# Patient Record
Sex: Male | Born: 1975 | Race: White | Hispanic: No | Marital: Married | Smoking: Former smoker
Health system: Southern US, Community
[De-identification: ages and names within clinical notes are randomized; demographics above are authoritative.]

## PROBLEM LIST (undated history)

## (undated) DIAGNOSIS — B1920 Unspecified viral hepatitis C without hepatic coma: Secondary | ICD-10-CM

## (undated) DIAGNOSIS — Q431 Hirschsprung's disease: Secondary | ICD-10-CM

## (undated) DIAGNOSIS — F988 Other specified behavioral and emotional disorders with onset usually occurring in childhood and adolescence: Secondary | ICD-10-CM

## (undated) DIAGNOSIS — M1990 Unspecified osteoarthritis, unspecified site: Secondary | ICD-10-CM

## (undated) HISTORY — PX: COLON SURGERY: SHX602

---

## 1998-05-01 DIAGNOSIS — B1920 Unspecified viral hepatitis C without hepatic coma: Secondary | ICD-10-CM

## 1998-05-01 HISTORY — DX: Unspecified viral hepatitis C without hepatic coma: B19.20

## 2014-02-10 ENCOUNTER — Other Ambulatory Visit (HOSPITAL_COMMUNITY): Payer: Self-pay | Admitting: Pediatrics

## 2014-02-10 DIAGNOSIS — Z8249 Family history of ischemic heart disease and other diseases of the circulatory system: Secondary | ICD-10-CM

## 2014-02-16 ENCOUNTER — Ambulatory Visit (HOSPITAL_COMMUNITY)
Admission: RE | Admit: 2014-02-16 | Discharge: 2014-02-16 | Disposition: A | Discharge status: Home / Self Care | Payer: 59 | Source: Ambulatory Visit | Attending: Pediatrics | Admitting: Pediatrics

## 2014-02-16 DIAGNOSIS — Z8249 Family history of ischemic heart disease and other diseases of the circulatory system: Secondary | ICD-10-CM | POA: Diagnosis present

## 2014-06-12 ENCOUNTER — Ambulatory Visit: Payer: Self-pay | Admitting: Family Medicine

## 2017-08-25 ENCOUNTER — Ambulatory Visit
Admission: EM | Admit: 2017-08-25 | Discharge: 2017-08-25 | Disposition: A | Discharge status: Home / Self Care | Payer: BLUE CROSS/BLUE SHIELD | Attending: Family Medicine | Admitting: Family Medicine

## 2017-08-25 ENCOUNTER — Other Ambulatory Visit: Payer: Self-pay

## 2017-08-25 DIAGNOSIS — J209 Acute bronchitis, unspecified: Secondary | ICD-10-CM

## 2017-08-25 HISTORY — DX: Unspecified viral hepatitis C without hepatic coma: B19.20

## 2017-08-25 HISTORY — DX: Other specified behavioral and emotional disorders with onset usually occurring in childhood and adolescence: F98.8

## 2017-08-25 MED ORDER — HYDROCOD POLST-CPM POLST ER 10-8 MG/5ML PO SUER: 5.0000 mL | Freq: Two times a day (BID) | ORAL | 0 refills | Status: DC | PRN

## 2017-08-25 MED ORDER — DOXYCYCLINE HYCLATE 100 MG PO CAPS: 100.0000 mg | ORAL_CAPSULE | Freq: Two times a day (BID) | ORAL | 0 refills | Status: DC

## 2017-08-25 NOTE — ED Triage Notes (Signed)
Pt c/o cough for about 4 weeks. He had a fever of 101 at the onset of sx. He thought he was improving last week, but suddenly worsened.

## 2017-08-25 NOTE — ED Provider Notes (Signed)
[facility]  CSN: [id number] Arrival date & time: 08/25/17  1209  History   Chief Complaint Chief Complaint  Patient presents with  . Cough   HPI  42 year old male presents with cough and wheezing.  Patient reports that he has had ongoing cough, wheezing for the past month.  He states that he initially improved but then worsened a few days ago.  He had a fever initially on but has had no fever recently.  He denies shortness of breath.  He continues to have productive cough.  Symptoms are severe.  No known exacerbating/relieving factors.  No other associated symptoms. No other complaints.  Past Medical History:  Diagnosis Date  . ADD (attention deficit disorder)   . Hepatitis C    Past Surgical History:  Procedure Laterality Date  . COLON SURGERY     Hirschprung's corrective surgery   Home Medications    Prior to Admission medications   Medication Sig Start Date End Date Taking? Authorizing Provider  chlorpheniramine-HYDROcodone (TUSSIONEX PENNKINETIC ER) 10-8 MG/5ML SUER Take 5 mLs by mouth every 12 (twelve) hours as needed. 08/25/17   Tommie Sams, DO  doxycycline (VIBRAMYCIN) 100 MG capsule Take 1 capsule (100 mg total) by mouth 2 (two) times daily. 08/25/17   Tommie Sams, DO    Family History Family History  Problem Relation Age of Onset  . Atrial fibrillation Mother   . Heart failure Father   . Diabetes Father   . Kidney failure Father     Social History Social History   Tobacco Use  . Smoking status: Former Games developer  . Smokeless tobacco: Never Used  . Tobacco comment: Former social smoker  Substance Use Topics  . Alcohol use: Yes    Alcohol/week: 5.4 oz    Types: 9 Cans of beer per week  . Drug use: Never     Allergies   Patient has no known allergies.   Review of Systems Review of Systems  Constitutional:       No recent fever.  Respiratory: Positive for shortness of breath and wheezing.    Physical Exam Triage Vital Signs ED  Triage Vitals  Enc Vitals Group     BP 08/25/17 1217 131/86     Pulse Rate 08/25/17 1217 73     Resp 08/25/17 1217 16     Temp 08/25/17 1217 97.7 F (36.5 C)     Temp Source 08/25/17 1217 Oral     SpO2 08/25/17 1217 99 %     Weight 08/25/17 1221 (!) 310 lb (140.6 kg)     Height 08/25/17 1221 6\' 3"  (1.905 m)     Head Circumference --      Peak Flow --      Pain Score 08/25/17 1218 0     Pain Loc --      Pain Edu? --      Excl. in GC? --   Updated Vital Signs BP 131/86 (BP Location: Left Arm)   Pulse 73   Temp 97.7 F (36.5 C) (Oral)   Resp 16   Ht 6\' 3"  (1.905 m)   Wt (!) 310 lb (140.6 kg)   SpO2 99%   BMI 38.75 kg/m   Physical Exam  Constitutional: He is oriented to person, place, and time. He appears well-developed. No distress.  HENT:  Head: Normocephalic and atraumatic.  Nose: Nose normal.  Normal TM's.  Cardiovascular: Normal rate and regular rhythm.  Pulmonary/Chest: Effort normal and breath sounds normal. He  has no wheezes. He has no rales.  Neurological: He is alert and oriented to person, place, and time.  Psychiatric: He has a normal mood and affect. His behavior is normal.  Nursing note and vitals reviewed.  UC Treatments / Results  Labs (all labs ordered are listed, but only abnormal results are displayed) Labs Reviewed - No data to display  EKG None Radiology No results found.  Procedures Procedures (including critical care time)  Medications Ordered in UC Medications - No data to display   Initial Impression / Assessment and Plan / UC Course  I have reviewed the triage vital signs and the nursing notes.  Pertinent labs & imaging results that were available during my care of the patient were reviewed by me and considered in my medical decision making (see chart for details).     42 year old male presents with subacute bronchitis.  Given duration of symptoms and recent worsening,treating with doxycycline.  Tussionex for cough.  Final  Clinical Impressions(s) / UC Diagnoses   Final diagnoses:  Subacute bronchitis    ED Discharge Orders        Ordered    doxycycline (VIBRAMYCIN) 100 MG capsule  2 times daily     08/25/17 1238    chlorpheniramine-HYDROcodone (TUSSIONEX PENNKINETIC ER) 10-8 MG/5ML SUER  Every 12 hours PRN     08/25/17 1238     Controlled Substance Prescriptions [place] Controlled Substance Registry consulted? Not Applicable   Tommie Sams, DO 08/25/17 1257

## 2017-12-17 ENCOUNTER — Ambulatory Visit (INDEPENDENT_AMBULATORY_CARE_PROVIDER_SITE_OTHER): Payer: BLUE CROSS/BLUE SHIELD

## 2017-12-17 ENCOUNTER — Ambulatory Visit
Admission: EM | Admit: 2017-12-17 | Discharge: 2017-12-17 | Disposition: A | Discharge status: Home / Self Care | Payer: BLUE CROSS/BLUE SHIELD | Attending: Internal Medicine | Admitting: Internal Medicine

## 2017-12-17 ENCOUNTER — Other Ambulatory Visit: Payer: Self-pay

## 2017-12-17 DIAGNOSIS — J069 Acute upper respiratory infection, unspecified: Secondary | ICD-10-CM

## 2017-12-17 MED ORDER — DOXYCYCLINE HYCLATE 100 MG PO CAPS: 100.0000 mg | ORAL_CAPSULE | Freq: Two times a day (BID) | ORAL | 0 refills | Status: DC

## 2017-12-17 MED ORDER — HYDROCOD POLST-CPM POLST ER 10-8 MG/5ML PO SUER: 5.0000 mL | Freq: Every evening | ORAL | 0 refills | Status: DC | PRN

## 2017-12-17 MED ORDER — ALBUTEROL SULFATE HFA 108 (90 BASE) MCG/ACT IN AERS: 2.0000 | INHALATION_SPRAY | Freq: Four times a day (QID) | RESPIRATORY_TRACT | 0 refills | Status: DC | PRN

## 2017-12-17 NOTE — ED Provider Notes (Signed)
[facility] ____________________________________________  Time seen: Approximately 9:18 AM  I have reviewed the triage vital signs and the nursing notes.   HISTORY  Chief Complaint Cough  HPI Robert Bush is a 42 y.o. male presenting for evaluation of cough and congestion symptoms present for approximately 2 weeks.  States however he has continued to have a cough occasionally since April.  States in April after a trip to [place] he ended up with bronchitis that nearly fully resolved.  States recently went to [place] again and subsequently cough and congestion symptoms.  States still with some nasal congestion, but states the nasal congestion has somewhat improved.  Initially had a sore throat which is since resolved.  Denies known fevers.  States cough is worse at night and first thing in the morning, productive of yellowish thick sputum.  No hemoptysis.  No extremity atypical  pain or  Swelling.  Has continue to remain active.  Did take some leftover amoxicillin without resolution.  Also had some leftover Tussionex that did help with his cough.  Continues to eat and drink well.  No other alleviating measures attempted.  States has had occasional wheezing, none persistently.  Denies others at home sick with similar.  Does have a history of exercise-induced asthma, no known allergy issues.  Reports otherwise feels well denies other complaints.  Denies chest pain, shortness of breath, abdominal pain, extremity pain, extremity swelling or rash. Denies recent sickness. Denies recent antibiotic use.   Delton Prairie, MD: PCP   Past Medical History:  Diagnosis Date  . ADD (attention deficit disorder)   . Hepatitis C     There are no active problems to display for this patient.   Past Surgical History:  Procedure Laterality Date  . COLON SURGERY     Hirschprung's corrective surgery     No current facility-administered medications for this encounter.   Current  Outpatient Medications:  .  albuterol (PROVENTIL HFA;VENTOLIN HFA) 108 (90 Base) MCG/ACT inhaler, Inhale 2 puffs into the lungs every 6 (six) hours as needed for wheezing., Disp: 1 Inhaler, Rfl: 0 .  chlorpheniramine-HYDROcodone (TUSSIONEX PENNKINETIC ER) 10-8 MG/5ML SUER, Take 5 mLs by mouth at bedtime as needed for cough. do not drive or operate machinery while taking as can cause drowsiness., Disp: 50 mL, Rfl: 0 .  doxycycline (VIBRAMYCIN) 100 MG capsule, Take 1 capsule (100 mg total) by mouth 2 (two) times daily., Disp: 20 capsule, Rfl: 0  Allergies Patient has no known allergies.  Family History  Problem Relation Age of Onset  . Atrial fibrillation Mother   . Heart failure Father   . Diabetes Father   . Kidney failure Father     Social History Social History   Tobacco Use  . Smoking status: Former Games developer  . Smokeless tobacco: Never Used  . Tobacco comment: Former social smoker  Substance Use Topics  . Alcohol use: Yes    Alcohol/week: 9.0 standard drinks    Types: 9 Cans of beer per week    Comment: occasionally  . Drug use: Never    Review of Systems Constitutional: No fever/chills ENT: AS above.  Cardiovascular: Denies chest pain. Respiratory: Denies shortness of breath. Gastrointestinal: No abdominal pain.  Skin: Negative for rash.   ____________________________________________   PHYSICAL EXAM:  VITAL SIGNS: ED Triage Vitals  Enc Vitals Group     BP 12/17/17 0823 138/90     Pulse Rate 12/17/17 0823 66     Resp 12/17/17 0823 18  Temp 12/17/17 0823 98.6 F (37 C)     Temp Source 12/17/17 0823 Oral     SpO2 12/17/17 0823 99 %     Weight 12/17/17 0821 (!) 320 lb (145.2 kg)     Height 12/17/17 0821 6\' 2"  (1.88 m)     Head Circumference --      Peak Flow --      Pain Score 12/17/17 0821 2     Pain Loc --      Pain Edu? --      Excl. in GC? --     Constitutional: Alert and oriented. Well appearing and in no acute distress. Eyes: Conjunctivae are  normal.  Head: Atraumatic. No sinus tenderness to palpation. No swelling. No erythema.  Ears: no erythema, normal TMs bilaterally.   Nose:Nasal congestion   Mouth/Throat: Mucous membranes are moist. No pharyngeal erythema. No tonsillar swelling or exudate.  Neck: No stridor.  No cervical spine tenderness to palpation. Hematological/Lymphatic/Immunilogical: No cervical lymphadenopathy. Cardiovascular: Normal rate, regular rhythm. Grossly normal heart sounds.  Good peripheral circulation. Respiratory: Normal respiratory effort.  No retractions. No wheezes.  Mild scattered rhonchi.  Good air movement.  Dry intermittent cough noted in room without bronchospasm.  Speaks in complete sentences. Musculoskeletal: Ambulatory with steady gait.  No lower extremity edema noted. Neurologic:  Normal speech and language. No gait instability. Skin:  Skin appears warm, dry and intact. No rash noted. Psychiatric: Mood and affect are normal. Speech and behavior are normal.  ___________________________________________   LABS (all labs ordered are listed, but only abnormal results are displayed)  Labs Reviewed - No data to display  RADIOLOGY  Dg Chest 2 View  Result Date: 12/17/2017 CLINICAL DATA:  Onset of cough 10 days ago while in [place]. Patient reports respiratory illness 4 months ago with residual cough since then. EXAM: CHEST - 2 VIEW COMPARISON:  None. FINDINGS: The lungs are adequately inflated and clear. The heart and pulmonary vascularity are normal. The mediastinum is normal in width. There is no pleural effusion. The trachea is midline. The bony thorax exhibits no acute abnormality. IMPRESSION: There is no pneumonia nor other acute cardiopulmonary abnormality. Electronically Signed   By: David  [place] M.D.   On: 12/17/2017 09:10   ____________________________________________   PROCEDURES Procedures    INITIAL IMPRESSION / ASSESSMENT AND PLAN / ED COURSE  Pertinent labs & imaging results  that were available during my care of the patient were reviewed by me and considered in my medical decision making (see chart for details).  Well-appearing patient.  No acute distress.  Suspect recent viral upper respiratory infection is with continued chest congestion and nasal congestion, will treat with oral doxycycline, PRN Tussionex.  No current wheezing, and patient does express concern as his insurance will be ending at the end of the month, will Rx albuterol inhaler.  Encourage rest, fluids, supportive care.Discussed indication, risks and benefits of medications with patient.  Discussed follow up with Primary care physician this week. Discussed follow up and return parameters including no resolution or any worsening concerns. Patient verbalized understanding and agreed to plan.   ____________________________________________   FINAL CLINICAL IMPRESSION(S) / ED DIAGNOSES  Final diagnoses:  Acute upper respiratory infection     ED Discharge Orders         Ordered    doxycycline (VIBRAMYCIN) 100 MG capsule  2 times daily     12/17/17 0928    chlorpheniramine-HYDROcodone (TUSSIONEX PENNKINETIC ER) 10-8 MG/5ML SUER  At bedtime PRN  12/17/17 0928    albuterol (PROVENTIL HFA;VENTOLIN HFA) 108 (90 Base) MCG/ACT inhaler  Every 6 hours PRN     12/17/17 0940           Note: This dictation was prepared with Dragon dictation along with smaller phrase technology. Any transcriptional errors that result from this process are unintentional.      lm   Renford Dills, NP 12/17/17 [phone]

## 2017-12-17 NOTE — Discharge Instructions (Addendum)
Take medication as prescribed. Rest. Drink plenty of fluids.  ° °Follow up with your primary care physician this week as needed. Return to Urgent care for new or worsening concerns.  ° °

## 2017-12-17 NOTE — ED Triage Notes (Signed)
Patient complains of cough that started 10 days ago. Patient states that he got sick while in [place] and returned last week. Patient states that he was sick back in April and cough has been residual since then. Patient reports that he is having a productive cough.

## 2018-03-31 HISTORY — PX: KNEE ARTHROSCOPY: SHX127

## 2018-04-19 ENCOUNTER — Other Ambulatory Visit: Payer: Self-pay | Admitting: Orthopedic Surgery

## 2018-04-19 ENCOUNTER — Other Ambulatory Visit (HOSPITAL_COMMUNITY): Payer: Self-pay | Admitting: Orthopedic Surgery

## 2018-04-19 DIAGNOSIS — M25511 Pain in right shoulder: Secondary | ICD-10-CM

## 2018-05-02 ENCOUNTER — Ambulatory Visit
Admission: RE | Admit: 2018-05-02 | Discharge: 2018-05-02 | Disposition: A | Discharge status: Home / Self Care | Payer: Medicaid Other | Source: Ambulatory Visit | Attending: Orthopedic Surgery | Admitting: Orthopedic Surgery

## 2018-05-02 DIAGNOSIS — M25511 Pain in right shoulder: Secondary | ICD-10-CM | POA: Insufficient documentation

## 2018-05-10 ENCOUNTER — Other Ambulatory Visit: Payer: Self-pay | Admitting: Orthopedic Surgery

## 2018-05-20 ENCOUNTER — Other Ambulatory Visit: Payer: Self-pay

## 2018-05-20 ENCOUNTER — Encounter
Admission: RE | Admit: 2018-05-20 | Discharge: 2018-05-20 | Disposition: A | Discharge status: Home / Self Care | Payer: Medicaid Other | Source: Ambulatory Visit | Attending: Orthopedic Surgery | Admitting: Orthopedic Surgery

## 2018-05-20 HISTORY — DX: Hirschsprung's disease: Q43.1

## 2018-05-20 HISTORY — DX: Unspecified osteoarthritis, unspecified site: M19.90

## 2018-05-20 NOTE — Pre-Procedure Instructions (Signed)
ECG 12-lead11/27/2019 [facility] Component Name Value Ref Range  Vent Rate (bpm) 65   PR Interval (msec) 222   QRS Interval (msec) 96   QT Interval (msec) 386   QTc (msec) 401   Other Result Information  This result has an attachment that is not available.  Result Narrative  Sinus rhythm 1st degree AV block Otherwise normal ECG No previous ECGs available I reviewed and concur with this report. Electronically signed EM:[email or web address] MD, MARK [phone]) on 04/01/2018 4:49:29 PM  Status Results Details   Encounter Summary

## 2018-05-20 NOTE — Patient Instructions (Signed)
Your procedure is scheduled on: 05-23-18 THURSDAY Report to Same Day Surgery 2nd floor medical mall [facility] Entrance-take elevator on left to 2nd floor.  Check in with surgery information desk.) To find out your arrival time please call [phone] between 1PM - 3PM on 05-22-18 [facility]  Remember: Instructions that are not followed completely may result in serious medical risk, up to and including death, or upon the discretion of your surgeon and anesthesiologist your surgery may need to be rescheduled.    _x___ 1. Do not eat food after midnight the night before your procedure. NO GUM OR CANDY AFTER MIDNIGHT.  You may drink clear liquids up to 2 hours before you are scheduled to arrive at the hospital for your procedure.  Do not drink clear liquids within 2 hours of your scheduled arrival to the hospital.  Clear liquids include  --Water or Apple juice without pulp  --Clear carbohydrate beverage such as ClearFast or Gatorade  --Black Coffee or Clear Tea (No milk, no creamers, do not add anything to the coffee or Tea   ____Ensure clear carbohydrate drink on the way to the hospital for bariatric patients  ____Ensure clear carbohydrate drink 3 hours before surgery for Dr Rutherford Nail patients if physician instructed.    __x__ 2. No Alcohol for 24 hours before or after surgery.   __x__3. No Smoking or e-cigarettes for 24 prior to surgery.  Do not use any chewable tobacco products for at least 6 hour prior to surgery   ____  4. Bring all medications with you on the day of surgery if instructed.    __x__ 5. Notify your doctor if there is any change in your medical condition     (cold, fever, infections).    x___6. On the morning of surgery brush your teeth with toothpaste and water.  You may rinse your mouth with mouth wash if you wish.  Do not swallow any toothpaste or mouthwash.   Do not wear jewelry, make-up, hairpins, clips or nail polish.  Do not wear lotions, powders, or perfumes.  You may wear deodorant.  Do not shave 48 hours prior to surgery. Men may shave face and neck.  Do not bring valuables to the hospital.    [facility] is not responsible for any belongings or valuables.               Contacts, dentures or bridgework may not be worn into surgery.  Leave your suitcase in the car. After surgery it may be brought to your room.  For patients admitted to the hospital, discharge time is determined by your treatment team.  _  Patients discharged the day of surgery will not be allowed to drive home.  You will need someone to drive you home and stay with you the night of your procedure.    Please read over the following fact sheets that you were given:   [facility] Preparing for Surgery  ____ Take anti-hypertensive listed below, cardiac, seizure, asthma, anti-reflux and psychiatric medicines. These include:  1. NONE  2.  3.  4.  5.  6.  ____Fleets enema or Magnesium Citrate as directed.   _x___ Use CHG Soap or sage wipes as directed on instruction sheet   ____ Use inhalers on the day of surgery and bring to hospital day of surgery  ____ Stop Metformin and Janumet 2 days prior to surgery.    ____ Take 1/2 of usual insulin dose the night before surgery and none on the  morning surgery.   ____ Follow recommendations from Cardiologist, Pulmonologist or PCP regarding stopping Aspirin, Coumadin, Plavix ,Eliquis, Effient, or Pradaxa, and Pletal.  X____Stop Anti-inflammatories such as Advil, Aleve, Ibuprofen, Motrin, Naproxen,MOBIC, Naprosyn, Goodies powders or aspirin products NOW-OK to take Tylenol    ____ Stop supplements until after surgery   ____ Bring C-Pap to the hospital.

## 2018-05-21 ENCOUNTER — Encounter
Admission: RE | Admit: 2018-05-21 | Discharge: 2018-05-21 | Disposition: A | Discharge status: Home / Self Care | Payer: Medicaid Other | Source: Ambulatory Visit | Attending: Orthopedic Surgery | Admitting: Orthopedic Surgery

## 2018-05-21 DIAGNOSIS — Z01812 Encounter for preprocedural laboratory examination: Secondary | ICD-10-CM | POA: Diagnosis present

## 2018-05-21 LAB — CBC WITH DIFFERENTIAL/PLATELET
Abs Immature Granulocytes: 0.02 10*3/uL (ref 0.00–0.07)
Basophils Absolute: 0 10*3/uL (ref 0.0–0.1)
Basophils Relative: 0 %
Eosinophils Absolute: 0.2 10*3/uL (ref 0.0–0.5)
Eosinophils Relative: 4 %
HCT: 44.5 % (ref 39.0–52.0)
Hemoglobin: 14.6 g/dL (ref 13.0–17.0)
Immature Granulocytes: 0 %
Lymphocytes Relative: 23 %
Lymphs Abs: 1.3 10*3/uL (ref 0.7–4.0)
MCH: 29.4 pg (ref 26.0–34.0)
MCHC: 32.8 g/dL (ref 30.0–36.0)
MCV: 89.7 fL (ref 80.0–100.0)
Monocytes Absolute: 0.4 10*3/uL (ref 0.1–1.0)
Monocytes Relative: 6 %
NEUTROS ABS: 3.9 10*3/uL (ref 1.7–7.7)
Neutrophils Relative %: 67 %
Platelets: 162 10*3/uL (ref 150–400)
RBC: 4.96 MIL/uL (ref 4.22–5.81)
RDW: 12.8 % (ref 11.5–15.5)
WBC: 5.8 10*3/uL (ref 4.0–10.5)
nRBC: 0 % (ref 0.0–0.2)

## 2018-05-21 LAB — BASIC METABOLIC PANEL
ANION GAP: 5 (ref 5–15)
BUN: 18 mg/dL (ref 6–20)
CO2: 27 mmol/L (ref 22–32)
Calcium: 9.4 mg/dL (ref 8.9–10.3)
Chloride: 108 mmol/L (ref 98–111)
Creatinine, Ser: 0.96 mg/dL (ref 0.61–1.24)
GFR calc Af Amer: >60 mL/min (ref >60)
GFR calc non Af Amer: >60 mL/min (ref >60)
Glucose, Bld: 135 mg/dL — ABNORMAL HIGH (ref 70–99)
POTASSIUM: 3.7 mmol/L (ref 3.5–5.1)
Sodium: 140 mmol/L (ref 135–145)

## 2018-05-21 LAB — APTT: APTT: 29 s (ref 24–36)

## 2018-05-21 LAB — PROTIME-INR
INR: 1.34
PROTHROMBIN TIME: 16.4 s — AB (ref 11.4–15.2)

## 2018-05-22 ENCOUNTER — Encounter: Payer: Self-pay | Admitting: *Deleted

## 2018-05-22 MED ORDER — CEFAZOLIN SODIUM-DEXTROSE 2-4 GM/100ML-% IV SOLN
2.0000 g | INTRAVENOUS | Status: AC
  Administered 2018-05-23: 2 g via INTRAVENOUS

## 2018-05-23 ENCOUNTER — Ambulatory Visit: Payer: Medicaid Other | Admitting: Certified Registered Nurse Anesthetist

## 2018-05-23 ENCOUNTER — Encounter: Payer: Self-pay | Admitting: Certified Registered Nurse Anesthetist

## 2018-05-23 ENCOUNTER — Other Ambulatory Visit: Payer: Self-pay

## 2018-05-23 ENCOUNTER — Ambulatory Visit
Admission: RE | Admit: 2018-05-23 | Discharge: 2018-05-23 | Disposition: A | Discharge status: Home / Self Care | Payer: Medicaid Other | Attending: Orthopedic Surgery | Admitting: Orthopedic Surgery

## 2018-05-23 ENCOUNTER — Encounter
Admission: RE | Disposition: A | Discharge status: Home / Self Care | Payer: Self-pay | Source: Home / Self Care | Attending: Orthopedic Surgery

## 2018-05-23 DIAGNOSIS — Z87891 Personal history of nicotine dependence: Secondary | ICD-10-CM | POA: Insufficient documentation

## 2018-05-23 DIAGNOSIS — M1990 Unspecified osteoarthritis, unspecified site: Secondary | ICD-10-CM | POA: Diagnosis not present

## 2018-05-23 DIAGNOSIS — M75121 Complete rotator cuff tear or rupture of right shoulder, not specified as traumatic: Secondary | ICD-10-CM | POA: Insufficient documentation

## 2018-05-23 DIAGNOSIS — B1920 Unspecified viral hepatitis C without hepatic coma: Secondary | ICD-10-CM | POA: Insufficient documentation

## 2018-05-23 DIAGNOSIS — Z791 Long term (current) use of non-steroidal anti-inflammatories (NSAID): Secondary | ICD-10-CM | POA: Diagnosis not present

## 2018-05-23 DIAGNOSIS — Q431 Hirschsprung's disease: Secondary | ICD-10-CM | POA: Insufficient documentation

## 2018-05-23 DIAGNOSIS — Z6841 Body Mass Index (BMI) 40.0 and over, adult: Secondary | ICD-10-CM | POA: Insufficient documentation

## 2018-05-23 DIAGNOSIS — Z79899 Other long term (current) drug therapy: Secondary | ICD-10-CM | POA: Insufficient documentation

## 2018-05-23 DIAGNOSIS — M25811 Other specified joint disorders, right shoulder: Secondary | ICD-10-CM | POA: Diagnosis not present

## 2018-05-23 DIAGNOSIS — X58XXXA Exposure to other specified factors, initial encounter: Secondary | ICD-10-CM | POA: Insufficient documentation

## 2018-05-23 DIAGNOSIS — S46211A Strain of muscle, fascia and tendon of other parts of biceps, right arm, initial encounter: Secondary | ICD-10-CM | POA: Insufficient documentation

## 2018-05-23 HISTORY — PX: SHOULDER ARTHROSCOPY WITH ROTATOR CUFF REPAIR: SHX5685

## 2018-05-23 SURGERY — ARTHROSCOPY, SHOULDER, WITH ROTATOR CUFF REPAIR
Anesthesia: General | Site: Shoulder | Laterality: Right

## 2018-05-23 MED ORDER — PROPOFOL 10 MG/ML IV BOLUS
INTRAVENOUS | Status: DC | PRN
  Administered 2018-05-23: 200 mg via INTRAVENOUS

## 2018-05-23 MED ORDER — EPINEPHRINE PF 1 MG/ML IJ SOLN
INTRAMUSCULAR | Status: DC | PRN
  Administered 2018-05-23: 8 mL

## 2018-05-23 MED ORDER — CEFAZOLIN SODIUM-DEXTROSE 2-4 GM/100ML-% IV SOLN
INTRAVENOUS | Status: AC
  Filled 2018-05-23: qty 100

## 2018-05-23 MED ORDER — LIDOCAINE HCL (PF) 1 % IJ SOLN
INTRAMUSCULAR | Status: DC | PRN
  Administered 2018-05-23: 3 mL via SUBCUTANEOUS

## 2018-05-23 MED ORDER — FAMOTIDINE 20 MG PO TABS
20.0000 mg | ORAL_TABLET | Freq: Once | ORAL | Status: AC
  Administered 2018-05-23: 20 mg via ORAL

## 2018-05-23 MED ORDER — MIDAZOLAM HCL 2 MG/2ML IJ SOLN
INTRAMUSCULAR | Status: DC | PRN
  Administered 2018-05-23: 2 mg via INTRAVENOUS

## 2018-05-23 MED ORDER — ONDANSETRON HCL 4 MG/2ML IJ SOLN
INTRAMUSCULAR | Status: AC
  Filled 2018-05-23: qty 2

## 2018-05-23 MED ORDER — CHLORHEXIDINE GLUCONATE CLOTH 2 % EX PADS: 6.0000 | MEDICATED_PAD | Freq: Once | CUTANEOUS | Status: DC

## 2018-05-23 MED ORDER — GLYCOPYRROLATE 0.2 MG/ML IJ SOLN
INTRAMUSCULAR | Status: DC | PRN
  Administered 2018-05-23 (×2): 0.1 mg via INTRAVENOUS

## 2018-05-23 MED ORDER — DEXAMETHASONE SODIUM PHOSPHATE 10 MG/ML IJ SOLN
INTRAMUSCULAR | Status: AC
  Filled 2018-05-23: qty 1

## 2018-05-23 MED ORDER — MIDAZOLAM HCL 2 MG/2ML IJ SOLN
INTRAMUSCULAR | Status: AC
  Filled 2018-05-23: qty 2

## 2018-05-23 MED ORDER — LIDOCAINE HCL 4 % MT SOLN
OROMUCOSAL | Status: DC | PRN
  Administered 2018-05-23: 4 mL via TOPICAL

## 2018-05-23 MED ORDER — SUGAMMADEX SODIUM 200 MG/2ML IV SOLN
INTRAVENOUS | Status: AC
  Filled 2018-05-23: qty 2

## 2018-05-23 MED ORDER — BUPIVACAINE LIPOSOME 1.3 % IJ SUSP
INTRAMUSCULAR | Status: DC | PRN
  Administered 2018-05-23: 20 mL via PERINEURAL

## 2018-05-23 MED ORDER — OXYCODONE HCL 5 MG PO TABS: 5.0000 mg | ORAL_TABLET | ORAL | 0 refills | Status: DC | PRN

## 2018-05-23 MED ORDER — BUPIVACAINE HCL (PF) 0.5 % IJ SOLN
INTRAMUSCULAR | Status: DC | PRN
  Administered 2018-05-23: 10 mL via PERINEURAL

## 2018-05-23 MED ORDER — ROCURONIUM BROMIDE 50 MG/5ML IV SOLN
INTRAVENOUS | Status: AC
  Filled 2018-05-23: qty 1

## 2018-05-23 MED ORDER — BUPIVACAINE LIPOSOME 1.3 % IJ SUSP
INTRAMUSCULAR | Status: AC
  Filled 2018-05-23: qty 20

## 2018-05-23 MED ORDER — EPHEDRINE SULFATE 50 MG/ML IJ SOLN
INTRAMUSCULAR | Status: DC | PRN
  Administered 2018-05-23: 5 mg via INTRAVENOUS

## 2018-05-23 MED ORDER — CEFAZOLIN SODIUM 1 G IJ SOLR
INTRAMUSCULAR | Status: AC
  Filled 2018-05-23: qty 10

## 2018-05-23 MED ORDER — CEFAZOLIN SODIUM-DEXTROSE 1-4 GM/50ML-% IV SOLN
INTRAVENOUS | Status: DC | PRN
  Administered 2018-05-23: 1 g via INTRAVENOUS

## 2018-05-23 MED ORDER — PROPOFOL 10 MG/ML IV BOLUS
INTRAVENOUS | Status: AC
  Filled 2018-05-23: qty 20

## 2018-05-23 MED ORDER — FENTANYL CITRATE (PF) 100 MCG/2ML IJ SOLN
INTRAMUSCULAR | Status: DC | PRN
  Administered 2018-05-23: 25 ug via INTRAVENOUS
  Administered 2018-05-23: 50 ug via INTRAVENOUS
  Administered 2018-05-23: 25 ug via INTRAVENOUS

## 2018-05-23 MED ORDER — LACTATED RINGERS IV SOLN
INTRAVENOUS | Status: DC
  Administered 2018-05-23: 10:00:00 via INTRAVENOUS

## 2018-05-23 MED ORDER — EPHEDRINE SULFATE 50 MG/ML IJ SOLN
INTRAMUSCULAR | Status: AC
  Filled 2018-05-23: qty 1

## 2018-05-23 MED ORDER — ONDANSETRON HCL 4 MG/2ML IJ SOLN
INTRAMUSCULAR | Status: DC | PRN
  Administered 2018-05-23: 4 mg via INTRAVENOUS

## 2018-05-23 MED ORDER — BUPIVACAINE HCL (PF) 0.5 % IJ SOLN
INTRAMUSCULAR | Status: AC
  Filled 2018-05-23: qty 10

## 2018-05-23 MED ORDER — DEXAMETHASONE SODIUM PHOSPHATE 10 MG/ML IJ SOLN
INTRAMUSCULAR | Status: DC | PRN
  Administered 2018-05-23 (×2): 5 mg via INTRAVENOUS

## 2018-05-23 MED ORDER — LIDOCAINE HCL (PF) 1 % IJ SOLN
INTRAMUSCULAR | Status: AC
  Filled 2018-05-23: qty 5

## 2018-05-23 MED ORDER — FAMOTIDINE 20 MG PO TABS
ORAL_TABLET | ORAL | Status: AC
  Administered 2018-05-23: 20 mg via ORAL
  Filled 2018-05-23: qty 1

## 2018-05-23 MED ORDER — ROCURONIUM BROMIDE 100 MG/10ML IV SOLN
INTRAVENOUS | Status: DC | PRN
  Administered 2018-05-23: 45 mg via INTRAVENOUS
  Administered 2018-05-23: 5 mg via INTRAVENOUS

## 2018-05-23 MED ORDER — ONDANSETRON HCL 4 MG PO TABS: 4.0000 mg | ORAL_TABLET | Freq: Three times a day (TID) | ORAL | 0 refills | Status: DC | PRN

## 2018-05-23 MED ORDER — LIDOCAINE HCL (PF) 2 % IJ SOLN
INTRAMUSCULAR | Status: AC
  Filled 2018-05-23: qty 10

## 2018-05-23 MED ORDER — FENTANYL CITRATE (PF) 100 MCG/2ML IJ SOLN
INTRAMUSCULAR | Status: AC
  Filled 2018-05-23: qty 2

## 2018-05-23 MED ORDER — LIDOCAINE HCL (CARDIAC) PF 100 MG/5ML IV SOSY
PREFILLED_SYRINGE | INTRAVENOUS | Status: DC | PRN
  Administered 2018-05-23: 100 mg via INTRAVENOUS

## 2018-05-23 MED ORDER — SUCCINYLCHOLINE CHLORIDE 20 MG/ML IJ SOLN
INTRAMUSCULAR | Status: DC | PRN
  Administered 2018-05-23: 100 mg via INTRAVENOUS

## 2018-05-23 MED ORDER — SUCCINYLCHOLINE CHLORIDE 20 MG/ML IJ SOLN
INTRAMUSCULAR | Status: AC
  Filled 2018-05-23: qty 1

## 2018-05-23 SURGICAL SUPPLY — 74 items
ADAPTER IRRIG TUBE 2 SPIKE SOL (ADAPTER) ×6 IMPLANT
ANCHOR ALL-SUT Q-FIX 2.8 (Anchor) ×6 IMPLANT
ANCHOR SUT 5.5 MULTIFIX (Orthopedic Implant) ×6 IMPLANT
ANCHOR SUT 5.5MM MULTIFIX (Orthopedic Implant) ×3 IMPLANT
ANCHOR SUT BIOC ST 3X145 (Anchor) ×9 IMPLANT
BUR RADIUS 4.0X18.5 (BURR) ×3 IMPLANT
BUR RADIUS 5.5 (BURR) ×3 IMPLANT
CANNULA 5.75X7 CRYSTAL CLEAR (CANNULA) ×6 IMPLANT
CANNULA PARTIAL THREAD 2X7 (CANNULA) ×6 IMPLANT
CANNULA TWIST IN 8.25X9CM (CANNULA) IMPLANT
CLOSURE WOUND 1/2 X4 (GAUZE/BANDAGES/DRESSINGS) ×2
CONNECTOR PERFECT PASSER (CONNECTOR) ×3 IMPLANT
COOLER POLAR GLACIER W/PUMP (MISCELLANEOUS) ×3 IMPLANT
COVER WAND RF STERILE (DRAPES) ×3 IMPLANT
CRADLE LAMINECT ARM (MISCELLANEOUS) ×3 IMPLANT
DEVICE SUCT BLK HOLE OR FLOOR (MISCELLANEOUS) IMPLANT
DRAPE IMP U-DRAPE 54X76 (DRAPES) ×6 IMPLANT
DRAPE INCISE IOBAN 66X45 STRL (DRAPES) ×3 IMPLANT
DRAPE SHEET LG 3/4 BI-LAMINATE (DRAPES) ×3 IMPLANT
DRAPE U-SHAPE 47X51 STRL (DRAPES) IMPLANT
DURAPREP 26ML APPLICATOR (WOUND CARE) ×9 IMPLANT
ELECT REM PT RETURN 9FT ADLT (ELECTROSURGICAL) ×3
ELECTRODE REM PT RTRN 9FT ADLT (ELECTROSURGICAL) ×1 IMPLANT
GAUZE PETRO XEROFOAM 1X8 (MISCELLANEOUS) ×3 IMPLANT
GAUZE SPONGE 4X4 12PLY STRL (GAUZE/BANDAGES/DRESSINGS) ×6 IMPLANT
GLOVE BIOGEL PI IND STRL 9 (GLOVE) ×1 IMPLANT
GLOVE BIOGEL PI INDICATOR 9 (GLOVE) ×2
GLOVE SURG 9.0 ORTHO LTXF (GLOVE) ×6 IMPLANT
GOWN STRL REUS TWL 2XL XL LVL4 (GOWN DISPOSABLE) ×3 IMPLANT
GOWN STRL REUS W/ TWL LRG LVL3 (GOWN DISPOSABLE) ×1 IMPLANT
GOWN STRL REUS W/ TWL LRG LVL4 (GOWN DISPOSABLE) ×1 IMPLANT
GOWN STRL REUS W/TWL LRG LVL3 (GOWN DISPOSABLE) ×2
GOWN STRL REUS W/TWL LRG LVL4 (GOWN DISPOSABLE) ×2
IV LACTATED RINGER IRRG 3000ML (IV SOLUTION) ×12
IV LR IRRIG 3000ML ARTHROMATIC (IV SOLUTION) ×6 IMPLANT
KIT STABILIZATION SHOULDER (MISCELLANEOUS) ×3 IMPLANT
KIT SUTURE 2.8 Q-FIX DISP (MISCELLANEOUS) ×3 IMPLANT
KIT SUTURETAK 3.0 INSERT PERC (KITS) ×3 IMPLANT
KIT TURNOVER KIT A (KITS) ×3 IMPLANT
MANIFOLD NEPTUNE II (INSTRUMENTS) ×3 IMPLANT
MASK FACE SPIDER DISP (MASK) ×3 IMPLANT
MAT ABSORB  FLUID 56X50 GRAY (MISCELLANEOUS) ×4
MAT ABSORB FLUID 56X50 GRAY (MISCELLANEOUS) ×2 IMPLANT
NDL SAFETY ECLIPSE 18X1.5 (NEEDLE) ×1 IMPLANT
NEEDLE HYPO 18GX1.5 SHARP (NEEDLE) ×2
NEEDLE HYPO 22GX1.5 SAFETY (NEEDLE) ×3 IMPLANT
NS IRRIG 500ML POUR BTL (IV SOLUTION) ×3 IMPLANT
PACK ARTHROSCOPY SHOULDER (MISCELLANEOUS) ×3 IMPLANT
PAD WRAPON POLAR SHDR XLG (MISCELLANEOUS) ×1 IMPLANT
PASSER SUT CAPTURE FIRST (SUTURE) ×3 IMPLANT
SET TUBE SUCT SHAVER OUTFL 24K (TUBING) ×3 IMPLANT
SET TUBE TIP INTRA-ARTICULAR (MISCELLANEOUS) ×3 IMPLANT
STRAP SAFETY 5IN WIDE (MISCELLANEOUS) ×3 IMPLANT
STRIP CLOSURE SKIN 1/2X4 (GAUZE/BANDAGES/DRESSINGS) ×4 IMPLANT
SUT ETHILON 4-0 (SUTURE) ×4
SUT ETHILON 4-0 FS2 18XMFL BLK (SUTURE) ×2
SUT LASSO 90 DEG SD STR (SUTURE) ×3 IMPLANT
SUT MNCRL 4-0 (SUTURE) ×2
SUT MNCRL 4-0 27XMFL (SUTURE) ×1
SUT PDS AB 0 CT1 27 (SUTURE) ×3 IMPLANT
SUT PERFECTPASSER WHITE CART (SUTURE) ×6 IMPLANT
SUT SMART STITCH CARTRIDGE (SUTURE) ×12 IMPLANT
SUT VIC AB 0 CT1 36 (SUTURE) ×3 IMPLANT
SUT VIC AB 2-0 CT2 27 (SUTURE) ×3 IMPLANT
SUTURE ETHLN 4-0 FS2 18XMF BLK (SUTURE) ×2 IMPLANT
SUTURE MAGNUM WIRE 2X48 BLK (SUTURE) IMPLANT
SUTURE MNCRL 4-0 27XMF (SUTURE) ×1 IMPLANT
SYR 10ML LL (SYRINGE) ×3 IMPLANT
TAPE MICROFOAM 4IN (TAPE) ×3 IMPLANT
TUBING ARTHRO INFLOW-ONLY STRL (TUBING) ×3 IMPLANT
TUBING CONNECTING 10 (TUBING) ×2 IMPLANT
TUBING CONNECTING 10' (TUBING) ×1
WAND WEREWOLF FLOW 90D (MISCELLANEOUS) ×3 IMPLANT
WRAPON POLAR PAD SHDR XLG (MISCELLANEOUS) ×3

## 2018-05-23 NOTE — H&P (Signed)
PREOPERATIVE H&P  Chief Complaint: RIGHT SHOULDER FULL THICKNESS ROTATOR CUFF TEAR  HPI: Robert Bush is a 43 y.o. male who presents for preoperative history and physical with a diagnosis of RIGHT SHOULDER FULL THICKNESS ROTATOR CUFF TEAR. Symptoms of pain, limitation of motion and weakness are significantly impairing activities of daily living and his ability to perform at work.  He has elected for surgical management.   Past Medical History:  Diagnosis Date  . ADD (attention deficit disorder)   . Arthritis   . Hepatitis C 2000   FROM BLOOD TRANSFUSION AS A BABY AFTER COLON SURGERY   . 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 COLON SURGERY     Hirschprung's corrective surgery  . KNEE ARTHROSCOPY Left 03/2018   Social History   Socioeconomic History  . Marital status: Married    Spouse name: Not on file  . Number of children: Not on file  . Years of education: Not on file  . Highest education level: Not on file  Occupational History  . Not on file  Social Needs  . Financial resource strain: Not on file  . Food insecurity:    Worry: Not on file    Inability: Not on file  . Transportation needs:    Medical: Not on file    Non-medical: Not on file  Tobacco Use  . Smoking status: Former Smoker    Types: Cigarettes  . Smokeless tobacco: Never Used  . Tobacco comment: Former social smoker  Substance and Sexual Activity  . Alcohol use: Yes    Alcohol/week: 9.0 standard drinks    Types: 9 Cans of beer per week    Comment: occasionally  . Drug use: Never  . Sexual activity: Not on file  Lifestyle  . Physical activity:    Days per week: Not on file    Minutes per session: Not on file  . Stress: Not on file  Relationships  . Social connections:    Talks on phone: Not on file    Gets together: Not on file    Attends religious service: Not on file    Active member of club or organization: Not on  file    Attends meetings of clubs or organizations: Not on file    Relationship status: Not on file  Other Topics Concern  . Not on file  Social History Narrative  . Not on file   Family History  Problem Relation Age of Onset  . Atrial fibrillation Mother   . Heart failure Father   . Diabetes Father   . Kidney failure Father    No Known Allergies Prior to Admission medications   Medication Sig Start Date End Date Taking? Authorizing Provider  meloxicam (MOBIC) 15 MG tablet Take 15 mg by mouth daily as needed for pain.   Yes [provider]     Positive ROS: All other systems have been reviewed and were otherwise negative with the exception of those mentioned in the HPI and as above.  Physical Exam: General: Alert, no acute distress Cardiovascular: Regular rate and rhythm, no murmurs rubs or gallops.  No pedal edema Respiratory: Clear to auscultation bilaterally, no wheezes rales or rhonchi. No cyanosis, no use of accessory musculature GI: No organomegaly, abdomen is soft and non-tender nondistended with positive bowel sounds. Skin: Skin intact, no lesions within the operative field. Neurologic: Sensation intact distally Psychiatric: Patient is competent for consent  with normal mood and affect Lymphatic: No  cervical lymphadenopathy  MUSCULOSKELETAL: Right shoulder: Patient skin is intact.  There is no erythema ecchymosis or swelling.  Patient demonstrates no muscle atrophy or asymmetry.  Patient has pain in the mid range of abduction and with forward elevation above 90 degrees.  He demonstrates shoulder abduction weakness.  He has positive impingement signs but no apprehension or instability.  He has full digital wrist and elbow range of motion, intact sensation light touch and palpable radial pulse.  Assessment: RIGHT SHOULDER FULL THICKNESS ROTATOR CUFF TEAR  Plan: Plan for Procedure(s): RIGHT SHOULDER ARTHROSCOPY WITH MINI OPEN ROTATOR CUFF REPAIR  I discussed  the details of the operation as well as the postoperative course with the patient and his family who was at the bedside in the preoperative area.  A preop history and physical was performed at the bedside.  Marked the right shoulder according the hospitals correct site of surgery protocol.  I discussed the risks and benefits of surgery. The risks include but are not limited to infection, bleeding, nerve or blood vessel injury, joint stiffness or loss of motion, persistent pain, weakness or instability, re-tear of the rotator cuff and hardware failure and the need for further surgery. Medical risks include but are not limited to DVT and pulmonary embolism, myocardial infarction, stroke, pneumonia, respiratory failure and death. Patient understood these risks and wished to proceed.     Juanell Fairly, MD   05/23/2018 11:27 AM

## 2018-05-23 NOTE — Anesthesia Procedure Notes (Signed)
Anesthesia Regional Block: Interscalene brachial plexus block   Pre-Anesthetic Checklist: ,, timeout performed, Correct Patient, Correct Site, Correct Laterality, Correct Procedure, Correct Position, site marked, Risks and benefits discussed,  Surgical consent,  Pre-op evaluation,  At surgeon's request and post-op pain management  Laterality: Right and Upper  Prep: chloraprep       Needles:  Injection technique: Single-shot  Needle Type: Stimiplex     Needle Length: 5cm  Needle Gauge: 22     Additional Needles:   Procedures:,,,, ultrasound used (permanent image in chart),,,,  Narrative:  Start time: 05/23/2018 11:02 AM End time: 05/23/2018 11:05 AM Injection made incrementally with aspirations every 5 mL.  Performed by: Personally  Anesthesiologist: Lenard Simmer, MD  Additional Notes: Functioning IV was confirmed and monitors were applied.  A 53mm 22ga Stimuplex needle was used. Sterile prep and drape,hand hygiene and sterile gloves were used.  Negative aspiration and negative test dose prior to incremental administration of local anesthetic. The patient tolerated the procedure well.

## 2018-05-23 NOTE — Anesthesia Post-op Follow-up Note (Signed)
Anesthesia QCDR form completed.        

## 2018-05-23 NOTE — Anesthesia Procedure Notes (Signed)
Procedure Name: Intubation Date/Time: 05/23/2018 11:45 AM Performed by: Eben Burow, CRNA Pre-anesthesia Checklist: Patient identified, Emergency Drugs available, Suction available and Patient being monitored Patient Re-evaluated:Patient Re-evaluated prior to induction Oxygen Delivery Method: Circle system utilized Preoxygenation: Pre-oxygenation with 100% oxygen Induction Type: IV induction Ventilation: Mask ventilation without difficulty Laryngoscope Size: McGraph and 4 Grade View: Grade I Tube type: Oral Tube size: 8.0 mm Number of attempts: 1 Airway Equipment and Method: Stylet,  Video-laryngoscopy and LTA kit utilized Placement Confirmation: positive ETCO2 and breath sounds checked- equal and bilateral Secured at: 23 cm Tube secured with: Tape Dental Injury: Teeth and Oropharynx as per pre-operative assessment

## 2018-05-23 NOTE — Op Note (Signed)
05/23/2018  3:20 PM  PATIENT:  Robert Bush  43 y.o. male  PRE-OPERATIVE DIAGNOSIS:  RIGHT SHOULDER FULL THICKNESS ROTATOR CUFF TEAR  POST-OPERATIVE DIAGNOSIS:  RIGHT SHOULDER FULL THICKNESS ROTATOR CUFF TEAR, CHRONIC ANTERIOR LABRAL TEAR, PARTIAL TEAR OF BICEPS AND SUBACROMIAL IMPINGEMENT  PROCEDURE:  Procedure(s): RIGHT SHOULDER ARTHROSCOPIC BICEPS TENODESIS, ANTERIOR LABRAL REPAIR ANDCAPSULORRHAPHY,  SUBACROMIAL DECOMPRESSION WITH MINI OPEN ROTATOR CUFF REPAIR   SURGEON:  Surgeon(s) and Role:    * Thornton Park, MD - Primary  ASSISTANT: Ave Filter, Tyler Deis PA student  ANESTHESIA:   general and paracervical block  PREOPERATIVE INDICATIONS:  Robert Bush is a  43 y.o. male with a diagnosis of RIGHT SHOULDER FULL THICKNESS ROTATOR CUFF TEAR who failed conservative measures and elected for surgical management.    The risks benefits and alternatives were discussed with the patient preoperatively including but not limited to the risks of infection, bleeding, nerve injury, persistent pain or weakness, failure of the hardware, re-tear of the rotator cuff and the need for further surgery. Medical risks include DVT and pulmonary embolism, myocardial infarction, stroke, pneumonia, respiratory failure and death. Patient understood these risks and wished to proceed.  OPERATIVE IMPLANTS: [place] Multifix anchors x 3 & [employer]hew Q Fix anchors x 2  OPERATIVE FINDINGS: Full-thickness tear involving the supra and infraspinatus with retraction to the articular margin of the humeral head, full-thickness tear of the biceps tendon, partial thickness tear of the superior aspect of the subscapularis without retraction, chronic anterior labral tear with anterior shoulder instability.  OPERATIVE PROCEDURE: The patient was met in the preoperative area. The right shoulder was signed with the word yes and my initials according the hospital's correct site of surgery protocol. The  patient underwent placement of an interscalene block with Exparel by the anesthesia service.  Patient was brought to the operating room where they underwent general endotracheal intubation following interscalene block . The patient was placed in a beachchair position. A spider arm positioner was used for this case. Examination under anesthesia revealed no limitation of passive motion but anterior laxity/subluxation with load shift testing. The patient had a negative sulcus sign.  The patient was prepped and draped in a sterile fashion. A timeout was performed to verify the patient's name, date of birth, medical record number, correct site of surgery and correct procedure to be performed there was also used to verify the patient received antibiotics that all appropriate instruments, implants and radiographs studies were available in the room. Once all in attendance were in agreement case began.  Bony landmarks were drawn out with a surgical marker along with proposed arthroscopy incisions.  An 11 blade was used to establish a posterior portal through which the arthroscope was placed in the glenohumeral joint. A full diagnostic examination of the shoulder was performed. the anterior portal was established under direct visualization with an 18-gauge spinal needle. A 5.75 the medial arthroscopic cannula was placed through this anterior portal.   The intra-articular portion of the biceps tendon was found to have advanced intratendinous degeneration with fraying and significant thickening of the tendon, therefore the decision was made to perform a tenodesis. Two Arthocare Perfect Pass sutures were placed from a lateral portal into the biceps tendon and a tenotomy was performed using an arthroscopic scissor.  A tenodesis was then performed in the anterior lateral portal established with an 18-gauge spinal needle.  A 7.0 arthroscopic cannula was placed through the anterolateral portal allowing for placement of a  multi  fix anchor.  The attention was then turned towards the anterior labrum.  Patient appeared to have a chronic tear with scarring of the labrum over the anterior glenoid.  There was loss of the normal labral bumper.  Decision was made to advance the anterior labrum and perform a capsulorrhaphy.  The labrum was mobilized using an arthroscopic elevator and 4 oh resector shaver blade.  3 Arthrex bio suture tack anchors were used for the repair.  A single limb of each anchor was passed under the anterior labrum and an arthroscopic knot tying technique was used to approximate the labrum to the glenoid reestablishing the labral bumper.  One anchor had suture on noted during the repair.  For only 2 anchors were used for repair of the anterior labrum.  Once the labrum was repaired it was probed and found to be stable.  The bumper was reestablished and the humeral head was stable to manual translation.  The arthroscope was then placed in the subacromial space. A lateral portal was then established using an 18-gauge spinal needle for localization.A subacromial decompression was also performed using a 5.5 mm resector shaver blade from the lateral portal.    Three Arthrocare Perfect Pass suture was placed in the lateral border of the rotator cuff tear. The greater tuberosity was debrided using a 5.5 mm resector shaver blade to remove all remaining foreign fibers of the rotator cuff. Debridement was performed until punctate bleeding was seen at the greater tuberosity footprint, which will allow for rotator cuff healing.  Final arthroscopic images were taken and all arthroscopic instruments were then removed and the mini-open portion of the procedure began.   A saber-type incision was made along the lateral border of the acromion. The deltoid muscle was identified and split in line with its fibers which allowed visualization of the rotator cuff.  The Perfect Pass suture previously placed in the lateral border of the  rotator cuff was also brought out through the deltoid split. Two additional perfect Pass sutures were placed in the lateral border of the rotator cuff.  Two Q fix anchors were then placed at the articular margin of the humeral head. The 4 limbs of these anchors were then passed medially through the rotator cuff with a perfect pass suture passer. These were clamped with a hemostat for later medial row fixation.  The five perfect pass sutures from the lateral border of the rotator cuff were then anchored to thegreater tuberosity of the humeral head using two [place] Multifix anchors. These anchors were tensioned to allow for anatomic reduction of the rotator cuff to the greater tuberosity footprint. The medial row repair was then completed using an arthroscopic knot tying technique with the Q fix anchor sutures. Once all sutures were tied down, arthroscopic images of thedouble row repair were taken with the arthroscope both externally and arthroscopically fromthe glenohumeral joint.  All incisions were copiously irrigated. The deltoid fascia was repaired using a 0 Vicryl suturean interrupted fashion. . The subcutaneous tissue of all incisions were closed with a 2-0 Vicryl. Skin closure for the arthroscopic incisions was performed with 4-0 nylon. The skin edges of the saber incision were approximated with a running 4-0 undyed Monocryl. A dry sterile dressing was applied. The patient was placed in an abduction sling, with a Polar Care sleeve.  All sharp and instrument counts were correct at the conclusion of the case. I was scrubbed and present for the entire case. I spoke with the patient's family postoperatively to  let them know the case had been performed without complication and the patient was stable in recovery room.

## 2018-05-23 NOTE — Anesthesia Preprocedure Evaluation (Signed)
Anesthesia Evaluation  Patient identified by MRN, date of birth, ID band Patient awake    Reviewed: Allergy & Precautions, H&P , NPO status , Patient's Chart, lab work & pertinent test results, reviewed documented beta blocker date and time   History of Anesthesia Complications Negative for: history of anesthetic complications  Airway Mallampati: I  TM Distance: >3 FB Neck ROM: full    Dental  (+) Dental Advidsory Given   Pulmonary neg shortness of breath, asthma , neg sleep apnea, neg COPD, neg recent URI, former smoker,           Cardiovascular Exercise Tolerance: Good negative cardio ROS       Neuro/Psych PSYCHIATRIC DISORDERS negative neurological ROS     GI/Hepatic negative GI ROS, (+) Hepatitis - (s/p treatment), C  Endo/Other  neg diabetesMorbid obesity  Renal/GU negative Renal ROS  negative genitourinary   Musculoskeletal   Abdominal   Peds  Hematology negative hematology ROS (+)   Anesthesia Other Findings 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   Reproductive/Obstetrics negative OB ROS                             Anesthesia Physical Anesthesia Plan  ASA: III  Anesthesia Plan: General   Post-op Pain Management:  Regional for Post-op pain   Induction: Intravenous  PONV Risk Score and Plan: 2 and Ondansetron, Dexamethasone, Midazolam, Promethazine and Treatment may vary due to age or medical condition  Airway Management Planned: Oral ETT  Additional Equipment:   Intra-op Plan:   Post-operative Plan: Extubation in OR  Informed Consent: I have reviewed the patients History and Physical, chart, labs and discussed the procedure including the risks, benefits and  alternatives for the proposed anesthesia with the patient or authorized representative who has indicated his/her understanding and acceptance.     Dental Advisory Given  Plan Discussed with: Anesthesiologist, CRNA and Surgeon  Anesthesia Plan Comments:         Anesthesia Quick Evaluation

## 2018-05-23 NOTE — Transfer of Care (Signed)
Immediate Anesthesia Transfer of Care Note  Patient: Robert Bush  Procedure(s) Performed: SHOULDER ARTHROSCOPY WITH MINI OPEN ROTATOR CUFF REPAIR (Right Shoulder)  Patient Location: PACU  Anesthesia Type:General  Level of Consciousness: awake, alert  and oriented  Airway & Oxygen Therapy: Patient Spontanous Breathing and Patient connected to face mask oxygen  Post-op Assessment: Report given to RN and Post -op Vital signs reviewed and stable  Post vital signs: Reviewed and stable  Last Vitals:  Vitals Value Taken Time  BP 120/69 05/23/2018  3:26 PM  Temp    Pulse 83 05/23/2018  3:26 PM  Resp 26 05/23/2018  3:26 PM  SpO2 98 % 05/23/2018  3:26 PM  Vitals shown include unvalidated device data.  Last Pain:  Vitals:   05/23/18 1526  TempSrc:   PainSc: 0-No pain         Complications: No apparent anesthesia complications

## 2018-05-23 NOTE — OR Nursing (Signed)
Dr Karlton Lemon in to see patient and assess post block.

## 2018-05-23 NOTE — Discharge Instructions (Signed)

## 2018-05-24 ENCOUNTER — Encounter: Payer: Self-pay | Admitting: Orthopedic Surgery

## 2018-05-25 NOTE — Anesthesia Postprocedure Evaluation (Signed)
Anesthesia Post Note  Patient: Nikolos Corkill  Procedure(s) Performed: SHOULDER ARTHROSCOPY WITH MINI OPEN ROTATOR CUFF REPAIR (Right Shoulder)  Patient location during evaluation: PACU Anesthesia Type: General Level of consciousness: awake and alert Pain management: pain level controlled Vital Signs Assessment: post-procedure vital signs reviewed and stable Respiratory status: spontaneous breathing, nonlabored ventilation, respiratory function stable and patient connected to nasal cannula oxygen Cardiovascular status: blood pressure returned to baseline and stable Postop Assessment: no apparent nausea or vomiting Anesthetic complications: no     Last Vitals:  Vitals:   05/23/18 1616 05/23/18 1659  BP: 116/79 115/75  Pulse: 73   Resp: 16   Temp: (!) 36.4 C 36.7 C  SpO2: 96% 98%    Last Pain:  Vitals:   05/24/18 0851  TempSrc:   PainSc: 0-No pain                 Lenard Simmer

## 2018-06-24 ENCOUNTER — Other Ambulatory Visit: Payer: Self-pay | Admitting: Gastroenterology

## 2018-06-24 DIAGNOSIS — K760 Fatty (change of) liver, not elsewhere classified: Secondary | ICD-10-CM

## 2018-06-24 DIAGNOSIS — Z8619 Personal history of other infectious and parasitic diseases: Secondary | ICD-10-CM

## 2018-06-28 ENCOUNTER — Ambulatory Visit
Admission: RE | Admit: 2018-06-28 | Discharge: 2018-06-28 | Disposition: A | Discharge status: Home / Self Care | Payer: Medicaid Other | Source: Ambulatory Visit | Attending: Gastroenterology | Admitting: Gastroenterology

## 2018-06-28 DIAGNOSIS — K760 Fatty (change of) liver, not elsewhere classified: Secondary | ICD-10-CM | POA: Diagnosis present

## 2018-06-28 DIAGNOSIS — Z8619 Personal history of other infectious and parasitic diseases: Secondary | ICD-10-CM

## 2019-01-30 ENCOUNTER — Other Ambulatory Visit: Payer: Self-pay

## 2019-01-30 ENCOUNTER — Other Ambulatory Visit
Admission: RE | Admit: 2019-01-30 | Discharge: 2019-01-30 | Disposition: A | Discharge status: Home / Self Care | Payer: Medicaid Other | Source: Ambulatory Visit | Attending: Gastroenterology | Admitting: Gastroenterology

## 2019-01-30 DIAGNOSIS — Z20828 Contact with and (suspected) exposure to other viral communicable diseases: Secondary | ICD-10-CM | POA: Diagnosis present

## 2019-01-31 LAB — SARS CORONAVIRUS 2 (TAT 6-24 HRS): SARS Coronavirus 2: NEGATIVE

## 2019-02-03 ENCOUNTER — Ambulatory Visit: Payer: Medicaid Other | Admitting: Certified Registered"

## 2019-02-03 ENCOUNTER — Ambulatory Visit
Admission: RE | Admit: 2019-02-03 | Discharge: 2019-02-03 | Disposition: A | Discharge status: Home / Self Care | Payer: Medicaid Other | Attending: Gastroenterology | Admitting: Gastroenterology

## 2019-02-03 ENCOUNTER — Encounter
Admission: RE | Disposition: A | Discharge status: Home / Self Care | Payer: Self-pay | Source: Home / Self Care | Attending: Gastroenterology

## 2019-02-03 ENCOUNTER — Encounter: Payer: Self-pay | Admitting: Certified Registered Nurse Anesthetist

## 2019-02-03 DIAGNOSIS — Z8619 Personal history of other infectious and parasitic diseases: Secondary | ICD-10-CM | POA: Diagnosis not present

## 2019-02-03 DIAGNOSIS — E8801 Alpha-1-antitrypsin deficiency: Secondary | ICD-10-CM | POA: Diagnosis not present

## 2019-02-03 DIAGNOSIS — K2970 Gastritis, unspecified, without bleeding: Secondary | ICD-10-CM | POA: Insufficient documentation

## 2019-02-03 DIAGNOSIS — M1990 Unspecified osteoarthritis, unspecified site: Secondary | ICD-10-CM | POA: Diagnosis not present

## 2019-02-03 DIAGNOSIS — F988 Other specified behavioral and emotional disorders with onset usually occurring in childhood and adolescence: Secondary | ICD-10-CM | POA: Insufficient documentation

## 2019-02-03 DIAGNOSIS — K7460 Unspecified cirrhosis of liver: Secondary | ICD-10-CM | POA: Insufficient documentation

## 2019-02-03 DIAGNOSIS — K449 Diaphragmatic hernia without obstruction or gangrene: Secondary | ICD-10-CM | POA: Diagnosis not present

## 2019-02-03 DIAGNOSIS — K760 Fatty (change of) liver, not elsewhere classified: Secondary | ICD-10-CM | POA: Diagnosis not present

## 2019-02-03 DIAGNOSIS — Z79899 Other long term (current) drug therapy: Secondary | ICD-10-CM | POA: Insufficient documentation

## 2019-02-03 DIAGNOSIS — K2100 Gastro-esophageal reflux disease with esophagitis, without bleeding: Secondary | ICD-10-CM | POA: Insufficient documentation

## 2019-02-03 HISTORY — PX: ESOPHAGOGASTRODUODENOSCOPY (EGD) WITH PROPOFOL: SHX5813

## 2019-02-03 SURGERY — ESOPHAGOGASTRODUODENOSCOPY (EGD) WITH PROPOFOL
Anesthesia: General

## 2019-02-03 MED ORDER — LIDOCAINE HCL (PF) 1 % IJ SOLN
INTRAMUSCULAR | Status: AC
  Filled 2019-02-03: qty 2

## 2019-02-03 MED ORDER — PROPOFOL 500 MG/50ML IV EMUL
INTRAVENOUS | Status: DC | PRN
  Administered 2019-02-03: 130 ug/kg/min via INTRAVENOUS

## 2019-02-03 MED ORDER — PROPOFOL 500 MG/50ML IV EMUL
INTRAVENOUS | Status: AC
  Filled 2019-02-03: qty 50

## 2019-02-03 MED ORDER — MIDAZOLAM HCL 2 MG/2ML IJ SOLN
INTRAMUSCULAR | Status: DC | PRN
  Administered 2019-02-03: 2 mg via INTRAVENOUS

## 2019-02-03 MED ORDER — PROPOFOL 10 MG/ML IV BOLUS
INTRAVENOUS | Status: DC | PRN
  Administered 2019-02-03: 30 mg via INTRAVENOUS
  Administered 2019-02-03: 80 mg via INTRAVENOUS

## 2019-02-03 MED ORDER — MIDAZOLAM HCL 2 MG/2ML IJ SOLN
INTRAMUSCULAR | Status: AC
  Filled 2019-02-03: qty 2

## 2019-02-03 MED ORDER — SODIUM CHLORIDE 0.9 % IV SOLN
INTRAVENOUS | Status: DC
  Administered 2019-02-03: 1000 mL via INTRAVENOUS

## 2019-02-03 MED ORDER — LIDOCAINE HCL (CARDIAC) PF 100 MG/5ML IV SOSY
PREFILLED_SYRINGE | INTRAVENOUS | Status: DC | PRN
  Administered 2019-02-03: 50 mg via INTRAVENOUS

## 2019-02-03 NOTE — Transfer of Care (Signed)
Immediate Anesthesia Transfer of Care Note  Patient: Robert Bush  Procedure(s) Performed: ESOPHAGOGASTRODUODENOSCOPY (EGD) WITH PROPOFOL (N/A )  Patient Location: PACU and Endoscopy Unit  Anesthesia Type:General  Level of Consciousness: drowsy  Airway & Oxygen Therapy: Patient Spontanous Breathing and Patient connected to nasal cannula oxygen  Post-op Assessment: Report given to RN and Post -op Vital signs reviewed and stable  Post vital signs: Reviewed and stable  Last Vitals:  Vitals Value Taken Time  BP 137/99   Temp    Pulse 74 02/03/19 1353  Resp 20 02/03/19 1353  SpO2 93 % 02/03/19 1353  Vitals shown include unvalidated device data.  Last Pain: There were no vitals filed for this visit.       Complications: No apparent anesthesia complications

## 2019-02-03 NOTE — Anesthesia Postprocedure Evaluation (Signed)
Anesthesia Post Note  Patient: Robert Bush  Procedure(s) Performed: ESOPHAGOGASTRODUODENOSCOPY (EGD) WITH PROPOFOL (N/A )  Patient location during evaluation: Endoscopy Anesthesia Type: General Level of consciousness: awake and alert Pain management: pain level controlled Vital Signs Assessment: post-procedure vital signs reviewed and stable Respiratory status: spontaneous breathing and respiratory function stable Cardiovascular status: stable Anesthetic complications: no     Last Vitals:  Vitals:   02/03/19 1224 02/03/19 1354  BP: (!) 157/108 (!) 137/99  Pulse: 65   Resp: 16   Temp: (!) 36 C (!) 36.1 C  SpO2: 99%     Last Pain:  Vitals:   02/03/19 1354  TempSrc: Tympanic  PainSc: Asleep                 Abbrielle Batts K

## 2019-02-03 NOTE — H&P (Signed)
I have discussed the risks benefits and complications of procedures to include not limited to bleeding, infection, perforation and the risk of sedation and the patient wishes to proceed.Outpatient short stay form Pre-procedure 02/03/2019 1:27 PM Lollie Sails MD  Primary Physician: Dr. Glendon Axe  Reason for visit: EGD  History of present illness: Patient is a 43 year old male presenting today for an EGD in regards to his personal history of cirrhosis/fibrosis of the liver.  He has a history of hepatitis C virus that was treated and is been remission on recurrent testing.  He does drink some alcohol.  He does have a fatty liver and a MZ phenotype for alpha-1 antitrypsin deficiency.    Current Facility-Administered Medications:  .  0.9 %  sodium chloride infusion, , Intravenous, Continuous, Lollie Sails, MD, Last Rate: 20 mL/hr at 02/03/19 1254, 1,000 mL at 02/03/19 1254 .  lidocaine (PF) (XYLOCAINE) 1 % injection, , , ,   Medications Prior to Admission  Medication Sig Dispense Refill Last Dose  . ondansetron (ZOFRAN) 4 MG tablet Take 1 tablet (4 mg total) by mouth every 8 (eight) hours as needed for nausea or vomiting. 30 tablet 0   . oxyCODONE (OXY IR/ROXICODONE) 5 MG immediate release tablet Take 1 tablet (5 mg total) by mouth every 4 (four) hours as needed. 40 tablet 0      No Known Allergies   Past Medical History:  Diagnosis Date  . ADD (attention deficit disorder)   . Arthritis   . Hepatitis C 2000   FROM BLOOD TRANSFUSION AS A BABY AFTER COLON SURGERY   . Hirschsprung's disease    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    Review of systems:      Physical Exam    Heart and lungs: Regular rate and rhythm without rub or gallop lungs are bilaterally clear    HEENT: Normocephalic atraumatic eyes are anicteric    Other:    Pertinant exam for procedure: Soft nontender nondistended bowel sounds positive normoactive    Planned proceedures: EGD and indicated procedures.  I have discussed the risks benefits and complications of procedures to include not limited to bleeding, infection, perforation and the risk of sedation and the patient wishes to proceed.    Lollie Sails, MD Gastroenterology 02/03/2019  1:27 PM

## 2019-02-03 NOTE — Anesthesia Post-op Follow-up Note (Signed)
Anesthesia QCDR form completed.        

## 2019-02-03 NOTE — Anesthesia Preprocedure Evaluation (Signed)
Anesthesia Evaluation  Patient identified by MRN, date of birth, ID band Patient awake    Reviewed: Allergy & Precautions, NPO status , Patient's Chart, lab work & pertinent test results  History of Anesthesia Complications Negative for: history of anesthetic complications  Airway Mallampati: I       Dental   Pulmonary neg sleep apnea, neg COPD, Not current smoker, former smoker,           Cardiovascular (-) hypertension(-) Past MI and (-) CHF (-) dysrhythmias (-) Valvular Problems/Murmurs     Neuro/Psych neg Seizures    GI/Hepatic neg GERD  ,(+) Cirrhosis       , Hepatitis -, C  Endo/Other  neg diabetes  Renal/GU negative Renal ROS     Musculoskeletal   Abdominal   Peds  Hematology   Anesthesia Other Findings   Reproductive/Obstetrics                             Anesthesia Physical Anesthesia Plan  ASA: II  Anesthesia Plan: General   Post-op Pain Management:    Induction: Intravenous  PONV Risk Score and Plan: 2 and Propofol infusion and TIVA  Airway Management Planned: Nasal Cannula  Additional Equipment:   Intra-op Plan:   Post-operative Plan:   Informed Consent: I have reviewed the patients History and Physical, chart, labs and discussed the procedure including the risks, benefits and alternatives for the proposed anesthesia with the patient or authorized representative who has indicated his/her understanding and acceptance.       Plan Discussed with:   Anesthesia Plan Comments:         Anesthesia Quick Evaluation

## 2019-02-03 NOTE — Op Note (Signed)
[facility] Gastroenterology Patient Name: Melinda Pottinger Procedure Date: 02/03/2019 1:26 PM MRN: [id number] Account #: [id number] Date of Birth: 1975/10/27 Admit Type: Outpatient Age: 43 Room: [facility] ENDO ROOM 1 Gender: Male Note Status: Finalized Procedure:            Upper GI endoscopy Indications:          Cirrhosis rule out esophageal varices Providers:            Christena Deem, MD Referring MD:         Leotis Shames (Referring MD) Medicines:            Monitored Anesthesia Care Complications:        No immediate complications. Procedure:            Pre-Anesthesia Assessment:                       - ASA Grade Assessment: II - A patient with mild                        systemic disease.                       After obtaining informed consent, the endoscope was                        passed under direct vision. Throughout the procedure,                        the patient's blood pressure, pulse, and oxygen                        saturations were monitored continuously. The Endoscope                        was introduced through the mouth, and advanced to the                        third part of duodenum. The upper GI endoscopy was                        accomplished without difficulty. The patient tolerated                        the procedure well. Findings:      no evidence of esophageal varices.      LA Grade A (one or more mucosal breaks less than 5 mm, not extending       between tops of 2 mucosal folds) esophagitis with no bleeding was found.       Biopsies were taken with a cold forceps for histology.      The exam of the esophagus was otherwise normal.      Diffuse and patchy minimal inflammation characterized by congestion       (edema) and erythema was found in the gastric body. Biopsies were taken       with a cold forceps for histology are taken from the antrum and body of       the stomach.      The examined duodenum was normal.      The  cardia and gastric fundus were normal on retroflexion, no evidence  of gasrtric varices. Impression:           - LA Grade A reflux esophagitis. Biopsied.                       - Gastritis. Biopsied.                       - Normal examined duodenum. Recommendation:       - Discharge patient to home.                       - Soft diet for 2 days. Procedure Code(s):    --- Professional ---                       [phone], Esophagogastroduodenoscopy, flexible, transoral;                        with biopsy, single or multiple Diagnosis Code(s):    --- Professional ---                       K21.0, Gastro-esophageal reflux disease with esophagitis                       K29.70, Gastritis, unspecified, without bleeding                       K74.60, Unspecified cirrhosis of liver CPT copyright 2019 American Medical Association. All rights reserved. The codes documented in this report are preliminary and upon coder review may  be revised to meet current compliance requirements. Lollie Sails, MD 02/03/2019 1:55:16 PM This report has been signed electronically. Number of Addenda: 0 Note Initiated On: 02/03/2019 1:26 PM      [facility]

## 2019-02-04 ENCOUNTER — Encounter: Payer: Self-pay | Admitting: Gastroenterology

## 2019-02-06 LAB — SURGICAL PATHOLOGY

## 2019-11-29 ENCOUNTER — Other Ambulatory Visit: Payer: Self-pay

## 2019-11-29 ENCOUNTER — Emergency Department
Admission: EM | Admit: 2019-11-29 | Discharge: 2019-11-29 | Disposition: A | Discharge status: Home / Self Care | Payer: Medicaid Other | Attending: Emergency Medicine | Admitting: Emergency Medicine

## 2019-11-29 DIAGNOSIS — Z87891 Personal history of nicotine dependence: Secondary | ICD-10-CM | POA: Insufficient documentation

## 2019-11-29 DIAGNOSIS — Y929 Unspecified place or not applicable: Secondary | ICD-10-CM | POA: Insufficient documentation

## 2019-11-29 DIAGNOSIS — S6992XA Unspecified injury of left wrist, hand and finger(s), initial encounter: Secondary | ICD-10-CM | POA: Diagnosis not present

## 2019-11-29 DIAGNOSIS — W458XXA Other foreign body or object entering through skin, initial encounter: Secondary | ICD-10-CM | POA: Insufficient documentation

## 2019-11-29 DIAGNOSIS — Y999 Unspecified external cause status: Secondary | ICD-10-CM | POA: Diagnosis not present

## 2019-11-29 DIAGNOSIS — Z23 Encounter for immunization: Secondary | ICD-10-CM | POA: Insufficient documentation

## 2019-11-29 DIAGNOSIS — Y939 Activity, unspecified: Secondary | ICD-10-CM | POA: Diagnosis not present

## 2019-11-29 MED ORDER — LIDOCAINE HCL (PF) 1 % IJ SOLN
5.0000 mL | Freq: Once | INTRAMUSCULAR | Status: AC
  Administered 2019-11-29: 5 mL via INTRADERMAL
  Filled 2019-11-29: qty 5

## 2019-11-29 MED ORDER — CEPHALEXIN 500 MG PO CAPS: 500.0000 mg | ORAL_CAPSULE | Freq: Two times a day (BID) | ORAL | 0 refills | Status: AC

## 2019-11-29 MED ORDER — TETANUS-DIPHTH-ACELL PERTUSSIS 5-2.5-18.5 LF-MCG/0.5 IM SUSP
0.5000 mL | Freq: Once | INTRAMUSCULAR | Status: AC
  Administered 2019-11-29: 0.5 mL via INTRAMUSCULAR
  Filled 2019-11-29: qty 0.5

## 2019-11-29 NOTE — ED Notes (Signed)
Pt carrying fishing poles, states he slipped while walking on the bank heading back to the car, when the hook stuck him in the left hand near the base of the thumb.  Last possible tetanus shot was about 7-8 years ago.

## 2019-11-29 NOTE — ED Triage Notes (Signed)
Patient with fish hook at base of left thumb.

## 2019-11-29 NOTE — ED Provider Notes (Signed)
[name] Emergency Department Provider Note  ____________________________________________  Time seen: Approximately 10:05 PM  I have reviewed the triage vital signs and the nursing notes.   HISTORY  Chief Complaint Foreign Body   HPI Robert Bush is a 44 y.o. male who presents to the emergency department for treatment and evaluation of fishhook in the palm of the left hand.  He attempted to remove it at home but was unable to push it through the other side.  Tetanus booster was approximately 7 years ago.   Past Medical History:  Diagnosis Date  . ADD (attention deficit disorder)   . Arthritis   . Hepatitis C 2000   FROM BLOOD TRANSFUSION AS A BABY AFTER COLON SURGERY   . 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   Past Surgical History:  Procedure Laterality Date  . COLON SURGERY     Hirschprung's corrective surgery  . ESOPHAGOGASTRODUODENOSCOPY (EGD) WITH PROPOFOL N/A 02/03/2019   Procedure: ESOPHAGOGASTRODUODENOSCOPY (EGD) WITH PROPOFOL;  Surgeon: Christena Deem, MD;  Location: [facility] ENDOSCOPY;  Service: Endoscopy;  Laterality: N/A;  . KNEE ARTHROSCOPY Left 03/2018  . SHOULDER ARTHROSCOPY WITH ROTATOR CUFF REPAIR Right 05/23/2018   Procedure: SHOULDER ARTHROSCOPY WITH MINI OPEN ROTATOR CUFF REPAIR;  Surgeon: Juanell Fairly, MD;  Location: [facility] ORS;  Service: Orthopedics;  Laterality: Right;    Prior to Admission medications   Medication Sig Start Date End Date Taking? Authorizing Provider  cephALEXin (KEFLEX) 500 MG capsule Take 1 capsule (500 mg total) by mouth 2 (two) times daily for 7 days. 11/29/19 12/06/19  Tom Ragsdale, Kasandra Knudsen, FNP  ondansetron (ZOFRAN) 4 MG tablet Take 1 tablet (4 mg total) by mouth every 8 (eight) hours as needed for nausea or vomiting. 05/23/18   Juanell Fairly, MD  oxyCODONE (OXY IR/ROXICODONE) 5 MG immediate release tablet Take 1 tablet (5 mg total)  by mouth every 4 (four) hours as needed. 05/23/18   Juanell Fairly, MD    Allergies Patient has no known allergies.  Family History  Problem Relation Age of Onset  . Atrial fibrillation Mother   . Heart failure Father   . Diabetes Father   . Kidney failure Father     Social History Social History   Tobacco Use  . Smoking status: Former Smoker    Types: Cigarettes  . Smokeless tobacco: Never Used  . 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 Vaping Use: Never used  Substance Use Topics  . Alcohol use: Yes    Alcohol/week: 9.0 standard drinks    Types: 9 Cans of beer per week    Comment: occasionally  . Drug use: Never    Review of Systems  Constitutional: Negative for fever. Respiratory: Negative for cough or shortness of breath.  Musculoskeletal: Negative for myalgias Skin: Positive for retained foreign body in the palm of the left hand. Neurological: Negative for numbness or paresthesias. ____________________________________________   PHYSICAL EXAM:  VITAL SIGNS: ED Triage Vitals  Enc Vitals Group     BP 11/29/19 2124 (!) 134/91     Pulse Rate 11/29/19 2124 101     Resp 11/29/19 2124 16     Temp 11/29/19 2124 97.9 F (36.6 C)     Temp Source 11/29/19 2124 Oral     SpO2 11/29/19 2124 94 %     Weight 11/29/19 2122 (!) 338 lb (153.3 kg)  Height 11/29/19 2122 6\' 3"  (1.905 m)     Head Circumference --      Peak Flow --      Pain Score 11/29/19 2122 1     Pain Loc --      Pain Edu? --      Excl. in GC? --      Constitutional: Well appearing. Eyes: Conjunctivae are clear without discharge or drainage. Nose: No rhinorrhea noted. Mouth/Throat: Airway is patent.  Neck: No stridor. Unrestricted range of motion observed. Cardiovascular: Capillary refill is <3 seconds.  Respiratory: Respirations are even and unlabored.. Musculoskeletal: Unrestricted range of motion observed. Neurologic: Awake, alert, and oriented x 4.  Skin: 1 of 3 barbs of  the fissure in the soft tissue of the thenar eminence of the palm of the left hand.  ____________________________________________   LABS (all labs ordered are listed, but only abnormal results are displayed)  Labs Reviewed - No data to display ____________________________________________  EKG  Not indicated. ____________________________________________  RADIOLOGY  Not indicated ____________________________________________   PROCEDURES  .Foreign Body Removal  Date/Time: 11/30/2019 12:12 PM Performed by: 01/30/2020, FNP Authorized by: Chinita Pester, FNP  Body area: skin Anesthesia: local infiltration  Anesthesia: Local Anesthetic: lidocaine 1% without epinephrine Anesthetic total: 3 mL Patient cooperative: yes Removal mechanism: hemostat (Wire cutters) Dressing: antibiotic ointment Tendon involvement: none Depth: subcutaneous Complexity: simple 1 objects recovered. Objects recovered: Fishhook Post-procedure assessment: foreign body removed Patient tolerance: patient tolerated the procedure well with no immediate complications   ____________________________________________   INITIAL IMPRESSION / ASSESSMENT AND PLAN / ED COURSE  Robert Bush is a 44 y.o. male presenting to the emergency department for removal of fishhook.  See HPI for further details.  Foreign body was removed successfully as detailed above.  Antibiotic and mild compression bandage applied.  He will be discharged home with prescription for empiric antibiotic.  He intends to take Tylenol or ibuprofen as needed for pain.  He was encouraged to watch for infection and follow up with primary care for concerns.    Medications  lidocaine (PF) (XYLOCAINE) 1 % injection 5 mL (5 mLs Intradermal Given 11/29/19 2259)  Tdap (BOOSTRIX) injection 0.5 mL (0.5 mLs Intramuscular Given 11/29/19 2259)     Pertinent labs & imaging results that were available during my care of the patient were reviewed  by me and considered in my medical decision making (see chart for details).  ____________________________________________   FINAL CLINICAL IMPRESSION(S) / ED DIAGNOSES  Final diagnoses:  Fish hook injury of hand, left, initial encounter    ED Discharge Orders         Ordered    cephALEXin (KEFLEX) 500 MG capsule  2 times daily     Discontinue  Reprint     11/29/19 2250           Note:  This document was prepared using Dragon voice recognition software and may include unintentional dictation errors.   12/01/19, FNP 11/30/19 1216    01/30/20, MD 11/30/19 1459

## 2019-12-31 ENCOUNTER — Ambulatory Visit: Payer: Medicaid Other | Admitting: Dietician

## 2020-01-28 ENCOUNTER — Other Ambulatory Visit: Payer: Self-pay | Admitting: Gastroenterology

## 2020-01-28 DIAGNOSIS — K7402 Hepatic fibrosis, advanced fibrosis: Secondary | ICD-10-CM

## 2020-02-05 ENCOUNTER — Ambulatory Visit: Payer: Medicaid Other

## 2020-05-11 ENCOUNTER — Ambulatory Visit: Payer: 59

## 2020-09-07 ENCOUNTER — Ambulatory Visit: Payer: 59

## 2020-09-12 ENCOUNTER — Encounter: Payer: Self-pay | Admitting: Emergency Medicine

## 2020-09-12 ENCOUNTER — Other Ambulatory Visit: Payer: Self-pay

## 2020-09-12 ENCOUNTER — Ambulatory Visit
Admission: EM | Admit: 2020-09-12 | Discharge: 2020-09-12 | Disposition: A | Discharge status: Home / Self Care | Payer: 59 | Attending: Physician Assistant | Admitting: Physician Assistant

## 2020-09-12 DIAGNOSIS — R059 Cough, unspecified: Secondary | ICD-10-CM | POA: Diagnosis present

## 2020-09-12 DIAGNOSIS — Z28310 Unvaccinated for covid-19: Secondary | ICD-10-CM | POA: Diagnosis not present

## 2020-09-12 DIAGNOSIS — Z8619 Personal history of other infectious and parasitic diseases: Secondary | ICD-10-CM | POA: Insufficient documentation

## 2020-09-12 DIAGNOSIS — B349 Viral infection, unspecified: Secondary | ICD-10-CM | POA: Insufficient documentation

## 2020-09-12 DIAGNOSIS — Z20822 Contact with and (suspected) exposure to covid-19: Secondary | ICD-10-CM | POA: Insufficient documentation

## 2020-09-12 DIAGNOSIS — Z87891 Personal history of nicotine dependence: Secondary | ICD-10-CM | POA: Diagnosis not present

## 2020-09-12 MED ORDER — PREDNISONE 20 MG PO TABS: 40.0000 mg | ORAL_TABLET | Freq: Every day | ORAL | 0 refills | Status: AC

## 2020-09-12 MED ORDER — PROMETHAZINE-DM 6.25-15 MG/5ML PO SYRP: 5.0000 mL | ORAL_SOLUTION | Freq: Four times a day (QID) | ORAL | 0 refills | Status: DC | PRN

## 2020-09-12 NOTE — Discharge Instructions (Signed)
URI/COLD SYMPTOMS: Your exam today is consistent with a viral illness. Antibiotics are not indicated at this time. Use medications as directed, including cough syrup, nasal saline, and decongestants. Your symptoms should improve over the next few days and resolve within 7-10 days. Increase rest and fluids. F/u if symptoms worsen or predominate such as sore throat, ear pain, productive cough, shortness of breath, or if you develop high fevers or worsening fatigue over the next several days.    

## 2020-09-12 NOTE — ED Triage Notes (Signed)
Patient c/o cough and chest congestion that started on Monday.  Patient denies fevers.   

## 2020-09-12 NOTE — ED Provider Notes (Signed)
[facility]    CSN: [id number] Arrival date & time: 09/12/20  1510      History   Chief Complaint Chief Complaint  Patient presents with  . Cough    HPI Robert Bush is a 45 y.o. male presenting for approximately 1 week history of cough and chest congestion.  Patient says it feels like he is wheezing at times.  He says that he feels a lot of drainage in the back of his throat.  States the cough is mostly nonproductive.  No fevers or fatigue.  Denies any chest pain or shortness of breath.  He denies any nasal congestion, sinus pain, headaches.  Patient has been taking over-the-counter decongestants but says that he still coughs.  Patient concerned about the cough keeping it up sometimes.  He says that a couple family members had similar symptoms but nobody tested positive for COVID.  He took an at home COVID test that was negative.  He has not been vaccinated for COVID-19.  Admits personal history of COVID-19 6 months ago.  Patient has history of alpha 1 antitrypsin deficiency but denies any lung disease.  He does follow-up with pulmonologist for pulmonary function testing.  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  No other complaints today.  HPI  Past Medical History:  Diagnosis Date  . ADD (attention deficit disorder)   . Arthritis   . Hepatitis C 2000   FROM BLOOD TRANSFUSION AS A BABY AFTER COLON SURGERY   . 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   Past Surgical History:  Procedure Laterality Date  . COLON SURGERY     Hirschprung's corrective surgery  . ESOPHAGOGASTRODUODENOSCOPY (EGD) WITH PROPOFOL N/A 02/03/2019   Procedure: ESOPHAGOGASTRODUODENOSCOPY (EGD) WITH PROPOFOL;  Surgeon: Christena Deem, MD;  Location: [facility] ENDOSCOPY;  Service: Endoscopy;  Laterality: N/A;  . KNEE ARTHROSCOPY Left 03/2018  . SHOULDER ARTHROSCOPY WITH  ROTATOR CUFF REPAIR Right 05/23/2018   Procedure: SHOULDER ARTHROSCOPY WITH MINI OPEN ROTATOR CUFF REPAIR;  Surgeon: Juanell Fairly, MD;  Location: [facility] ORS;  Service: Orthopedics;  Laterality: Right;       Home Medications    Prior to Admission medications   Medication Sig Start Date End Date Taking? Authorizing Provider  predniSONE (DELTASONE) 20 MG tablet Take 2 tablets (40 mg total) by mouth daily for 5 days. 09/12/20 09/17/20 Yes Shirlee Latch, PA-C  promethazine-dextromethorphan (PROMETHAZINE-DM) 6.25-15 MG/5ML syrup Take 5 mLs by mouth 4 (four) times daily as needed for cough. 09/12/20  Yes Eusebio Friendly B, PA-C  ondansetron (ZOFRAN) 4 MG tablet Take 1 tablet (4 mg total) by mouth every 8 (eight) hours as needed for nausea or vomiting. 05/23/18   Juanell Fairly, MD  oxyCODONE (OXY IR/ROXICODONE) 5 MG immediate release tablet Take 1 tablet (5 mg total) by mouth every 4 (four) hours as needed. 05/23/18   Juanell Fairly, MD    Family History Family History  Problem Relation Age of Onset  . Atrial fibrillation Mother   . Heart failure Father   . Diabetes Father   . Kidney failure Father     Social History Social History   Tobacco Use  . Smoking status: Former Smoker    Types: Cigarettes  . Smokeless tobacco: Never Used  . 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 Vaping Use: Never used  Substance Use Topics  . Alcohol use: Yes    Alcohol/week: 9.0 standard drinks    Types: 9 Cans of beer per week    Comment: occasionally  . Drug use: Never     Allergies   Patient has no known allergies.   Review of Systems Review of Systems  Constitutional: Negative for fatigue and fever.  HENT: Positive for congestion. Negative for rhinorrhea, sinus pressure, sinus pain and sore throat.   Respiratory: Positive for cough. Negative for shortness of breath.   Cardiovascular: Negative for chest pain.  Gastrointestinal: Negative for abdominal pain, diarrhea, nausea  and vomiting.  Musculoskeletal: Negative for myalgias.  Neurological: Negative for weakness, light-headedness and headaches.  Hematological: Negative for adenopathy.     Physical Exam Triage Vital Signs ED Triage Vitals  Enc Vitals Group     BP 09/12/20 1545 (!) 133/94     Pulse Rate 09/12/20 1545 70     Resp 09/12/20 1545 16     Temp 09/12/20 1545 98 F (36.7 C)     Temp Source 09/12/20 1545 Oral     SpO2 09/12/20 1545 99 %     Weight 09/12/20 1543 (!) 315 lb (142.9 kg)     Height 09/12/20 1543 6\' 3"  (1.905 m)     Head Circumference --      Peak Flow --      Pain Score 09/12/20 1543 0     Pain Loc --      Pain Edu? --      Excl. in GC? --    No data found.  Updated Vital Signs BP (!) 133/94 (BP Location: Left Arm)   Pulse 70   Temp 98 F (36.7 C) (Oral)   Resp 16   Ht 6\' 3"  (1.905 m)   Wt (!) 315 lb (142.9 kg)   SpO2 99%   BMI 39.37 kg/m    Physical Exam Vitals and nursing note reviewed.  Constitutional:      General: He is not in acute distress.    Appearance: Normal appearance. He is well-developed. He is not ill-appearing or diaphoretic.  HENT:     Head: Normocephalic and atraumatic.     Right Ear: Tympanic membrane, ear canal and external ear normal.     Left Ear: Tympanic membrane, ear canal and external ear normal.     Nose: Congestion present.     Mouth/Throat:     Mouth: Mucous membranes are moist.     Pharynx: Oropharynx is clear. Uvula midline. Posterior oropharyngeal erythema (mild with clear PND) present. No oropharyngeal exudate.     Tonsils: No tonsillar abscesses.  Eyes:     General: No scleral icterus.       Right eye: No discharge.        Left eye: No discharge.     Conjunctiva/sclera: Conjunctivae normal.  Neck:     Thyroid: No thyromegaly.     Trachea: No tracheal deviation.  Cardiovascular:     Rate and Rhythm: Normal rate and regular rhythm.     Heart sounds: Normal heart sounds.  Pulmonary:     Effort: Pulmonary effort is  normal. No respiratory distress.     Breath sounds: Normal breath sounds. No wheezing, rhonchi or rales.  Musculoskeletal:     Cervical back: Normal range of motion and neck supple.  Lymphadenopathy:     Cervical: No cervical adenopathy.  Skin:    General: Skin is warm and dry.     Findings: No  rash.  Neurological:     General: No focal deficit present.     Mental Status: He is alert. Mental status is at baseline.     Motor: No weakness.     Gait: Gait normal.  Psychiatric:        Mood and Affect: Mood normal.        Behavior: Behavior normal.        Thought Content: Thought content normal.      UC Treatments / Results  Labs (all labs ordered are listed, but only abnormal results are displayed) Labs Reviewed  SARS CORONAVIRUS 2 (TAT 6-24 HRS)    EKG   Radiology No results found.  Procedures Procedures (including critical care time)  Medications Ordered in UC Medications - No data to display  Initial Impression / Assessment and Plan / UC Course  I have reviewed the triage vital signs and the nursing notes.  Pertinent labs & imaging results that were available during my care of the patient were reviewed by me and considered in my medical decision making (see chart for details).   45 year old male presenting for cough and congestion along with wheezing x1 week.  Vital signs are stable.  He is afebrile.  Oxygen is 99%.  Exam significant for nasal congestion and posterior pharyngeal erythema with moderate postnasal drainage.  Chest is clear to auscultation heart regular rate and rhythm.  COVID test obtained.  Current CDC guidelines, isolation protocol and ED precautions reviewed with patient.  I have prescribed Promethazine DM cough medication for him and encouraged him to increase his fluid intake to help break up the mucus.  Have also sent some prednisone for a few days since he states that he is wheezing.  I do not hear any wheezing on exam today.  Advised him to follow-up  with [place] for any worsening symptoms or if not better after the next 7 to 10 days.  Advised to return sooner if he develops a fever, worsening cough or breathing problem.   Final Clinical Impressions(s) / UC Diagnoses   Final diagnoses:  Viral illness  Cough     Discharge Instructions     URI/COLD SYMPTOMS: Your exam today is consistent with a viral illness. Antibiotics are not indicated at this time. Use medications as directed, including cough syrup, nasal saline, and decongestants. Your symptoms should improve over the next few days and resolve within 7-10 days. Increase rest and fluids. F/u if symptoms worsen or predominate such as sore throat, ear pain, productive cough, shortness of breath, or if you develop high fevers or worsening fatigue over the next several days.      ED Prescriptions    Medication Sig Dispense Auth. Provider   predniSONE (DELTASONE) 20 MG tablet Take 2 tablets (40 mg total) by mouth daily for 5 days. 10 tablet Shirlee Latch, PA-C   promethazine-dextromethorphan (PROMETHAZINE-DM) 6.25-15 MG/5ML syrup Take 5 mLs by mouth 4 (four) times daily as needed for cough. 118 mL Shirlee Latch, PA-C     PDMP not reviewed this encounter.   Shirlee Latch, PA-C 09/12/20 1617

## 2020-09-13 LAB — SARS CORONAVIRUS 2 (TAT 6-24 HRS): SARS Coronavirus 2: NEGATIVE

## 2020-10-20 ENCOUNTER — Ambulatory Visit
Admission: RE | Admit: 2020-10-20 | Discharge: 2020-10-20 | Disposition: A | Discharge status: Home / Self Care | Payer: 59 | Source: Ambulatory Visit | Attending: Gastroenterology | Admitting: Gastroenterology

## 2020-10-20 ENCOUNTER — Other Ambulatory Visit: Payer: Self-pay

## 2020-10-20 DIAGNOSIS — K7402 Hepatic fibrosis, advanced fibrosis: Secondary | ICD-10-CM | POA: Diagnosis present

## 2021-01-28 ENCOUNTER — Other Ambulatory Visit: Payer: Self-pay

## 2021-01-28 ENCOUNTER — Ambulatory Visit
Admission: EM | Admit: 2021-01-28 | Discharge: 2021-01-28 | Disposition: A | Discharge status: Home / Self Care | Payer: 59 | Attending: Physician Assistant | Admitting: Physician Assistant

## 2021-01-28 DIAGNOSIS — R059 Cough, unspecified: Secondary | ICD-10-CM | POA: Diagnosis present

## 2021-01-28 DIAGNOSIS — Z20822 Contact with and (suspected) exposure to covid-19: Secondary | ICD-10-CM | POA: Diagnosis not present

## 2021-01-28 DIAGNOSIS — J069 Acute upper respiratory infection, unspecified: Secondary | ICD-10-CM | POA: Diagnosis not present

## 2021-01-28 NOTE — ED Provider Notes (Addendum)
[facility]    CSN: [id number] Arrival date & time: 01/28/21  1420      History   Chief Complaint Chief Complaint  Patient presents with   Cough    HPI Robert Bush is a 45 y.o. male presenting for 3-day history of cough and nasal congestion.  Patient states that he would like to have a COVID test but he does report that he has been feeling better since onset of symptoms.  He says his friend who he brought here today is feeling a lot worse.  Patient does report that he recently went to a wedding and his daughter was sick at that time.  He says that multiple people from the waiting have tested positive for COVID.  Patient denies any fevers, weakness, breathing difficulty.  Has been taking over-the-counter cough medication with some improvement in symptoms.  Admits personal history of COVID-19 10 months ago.  Patient has history of alpha 1 antitrypsin deficiency but denies any lung disease.  He does follow-up with pulmonologist for pulmonary function testing.  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  No other complaints today.  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   Past Surgical History:  Procedure Laterality Date   COLON SURGERY     Hirschprung's corrective surgery   ESOPHAGOGASTRODUODENOSCOPY (EGD) WITH PROPOFOL N/A 02/03/2019   Procedure: ESOPHAGOGASTRODUODENOSCOPY (EGD) WITH PROPOFOL;  Surgeon: Christena Deem, MD;  Location: [facility] ENDOSCOPY;  Service: Endoscopy;  Laterality: N/A;   KNEE ARTHROSCOPY Left 03/2018   SHOULDER ARTHROSCOPY WITH ROTATOR CUFF REPAIR Right 05/23/2018   Procedure: SHOULDER ARTHROSCOPY WITH MINI OPEN ROTATOR CUFF REPAIR;  Surgeon:  Juanell Fairly, MD;  Location: [facility] ORS;  Service: Orthopedics;  Laterality: Right;       Home Medications    Prior to Admission medications   Medication Sig Start Date End Date Taking? Authorizing Provider  ondansetron (ZOFRAN) 4 MG tablet Take 1 tablet (4 mg total) by mouth every 8 (eight) hours as needed for nausea or vomiting. 05/23/18   Juanell Fairly, MD  oxyCODONE (OXY IR/ROXICODONE) 5 MG immediate release tablet Take 1 tablet (5 mg total) by mouth every 4 (four) hours as needed. 05/23/18   Juanell Fairly, MD    Family History Family History  Problem Relation Age of Onset   Atrial fibrillation Mother    Heart failure Father    Diabetes Father    Kidney failure Father     Social History Social History   Tobacco Use   Smoking status: Former    Types: Cigarettes   Smokeless tobacco: Never   Tobacco comments:    Former social smoker  Building services engineer Use: Never used  Substance Use Topics   Alcohol use: Yes    Alcohol/week: 9.0 standard drinks    Types: 9 Cans of beer per week    Comment: occasionally   Drug use: Never     Allergies   Patient has no known allergies.   Review of Systems Review of Systems  Constitutional:  Negative for fatigue and fever.  HENT:  Positive for congestion and rhinorrhea. Negative for sinus  pressure, sinus pain and sore throat.   Respiratory:  Positive for cough. Negative for shortness of breath.   Gastrointestinal:  Negative for abdominal pain, diarrhea, nausea and vomiting.  Musculoskeletal:  Negative for myalgias.  Neurological:  Negative for weakness, light-headedness and headaches.  Hematological:  Negative for adenopathy.    Physical Exam Triage Vital Signs ED Triage Vitals  Enc Vitals Group     BP 01/28/21 1438 126/72     Pulse Rate 01/28/21 1438 71     Resp 01/28/21 1438 16     Temp 01/28/21 1438 98.7 F (37.1 C)     Temp Source 01/28/21 1438 Oral     SpO2 01/28/21 1438 100 %     Weight 01/28/21 1437 (!)  325 lb (147.4 kg)     Height 01/28/21 1437 6\' 3"  (1.905 m)     Head Circumference --      Peak Flow --      Pain Score 01/28/21 1436 0     Pain Loc --      Pain Edu? --      Excl. in GC? --    No data found.  Updated Vital Signs BP 126/72 (BP Location: Right Arm)   Pulse 71   Temp 98.7 F (37.1 C) (Oral)   Resp 16   Ht 6\' 3"  (1.905 m)   Wt (!) 325 lb (147.4 kg)   SpO2 100%   BMI 40.62 kg/m      Physical Exam Vitals and nursing note reviewed.  Constitutional:      General: He is not in acute distress.    Appearance: Normal appearance. He is well-developed. He is not ill-appearing.  HENT:     Head: Normocephalic and atraumatic.     Nose: Congestion present.     Mouth/Throat:     Mouth: Mucous membranes are moist.     Pharynx: Oropharynx is clear.  Eyes:     General: No scleral icterus.    Conjunctiva/sclera: Conjunctivae normal.  Cardiovascular:     Rate and Rhythm: Normal rate and regular rhythm.     Heart sounds: Normal heart sounds.  Pulmonary:     Effort: Pulmonary effort is normal. No respiratory distress.     Breath sounds: Normal breath sounds.  Musculoskeletal:     Cervical back: Neck supple.  Skin:    General: Skin is warm and dry.  Neurological:     General: No focal deficit present.     Mental Status: He is alert. Mental status is at baseline.     Motor: No weakness.     Coordination: Coordination normal.     Gait: Gait normal.  Psychiatric:        Mood and Affect: Mood normal.        Behavior: Behavior normal.        Thought Content: Thought content normal.     UC Treatments / Results  Labs (all labs ordered are listed, but only abnormal results are displayed) Labs Reviewed  SARS CORONAVIRUS 2 (TAT 6-24 HRS)    EKG   Radiology No results found.  Procedures Procedures (including critical care time)  Medications Ordered in UC Medications - No data to display  Initial Impression / Assessment and Plan / UC Course  I have reviewed  the triage vital signs and the nursing notes.  Pertinent labs & imaging results that were available during my care of the patient were reviewed by me and considered in my medical decision making (see  chart for details).  45 year old male presenting for 3-day history of cough and congestion.  Likely COVID-19 exposure.  Vitals all normal and stable patient overall well-appearing.  Exam significant for minor congestion, otherwise normal.  He is not ill-appearing.  Chest is clear to auscultation and heart regular rate and rhythm.  PCR COVID test obtained.  Current CDC guidelines, isolation protocol and ED precautions reviewed with patient.  Advised to continue with supportive care.  I did offer cough medication but he says he is fine with taking the over-the-counter medications.  Advised to increase rest and fluids.  Return to ED precautions reviewed.  Final Clinical Impressions(s) / UC Diagnoses   Final diagnoses:  Viral upper respiratory tract infection  Cough  Exposure to COVID-19 virus     Discharge Instructions      URI/COLD SYMPTOMS: Your exam today is consistent with a viral illness. Antibiotics are not indicated at this time. Use medications as directed, including cough syrup, nasal saline, and decongestants. Your symptoms should improve over the next few days and resolve within 7-10 days. Increase rest and fluids. F/u if symptoms worsen or predominate such as sore throat, ear pain, productive cough, shortness of breath, or if you develop high fevers or worsening fatigue over the next several days.    You have received COVID testing today either for positive exposure, concerning symptoms that could be related to COVID infection, screening purposes, or re-testing after confirmed positive.  Your test obtained today checks for active viral infection in the last 1-2 weeks. If your test is negative now, you can still test positive later. So, if you do develop symptoms you should either get  re-tested and/or isolate x 5 days and then strict mask use x 5 days (unvaccinated) or mask use x 10 days (vaccinated). Please follow CDC guidelines.  While Rapid antigen tests come back in 15-20 minutes, send out PCR/molecular test results typically come back within 1-3 days. In the mean time, if you are symptomatic, assume this could be a positive test and treat/monitor yourself as if you do have COVID.   We will call with test results if positive. Please download the MyChart app and set up a profile to access test results.   If symptomatic, go home and rest. Push fluids. Take Tylenol as needed for discomfort. Gargle warm salt water. Throat lozenges. Take Mucinex DM or Robitussin for cough. Humidifier in bedroom to ease coughing. Warm showers. Also review the COVID handout for more information.  COVID-19 INFECTION: The incubation period of COVID-19 is approximately 14 days after exposure, with most symptoms developing in roughly 4-5 days. Symptoms may range in severity from mild to critically severe. Roughly 80% of those infected will have mild symptoms. People of any age may become infected with COVID-19 and have the ability to transmit the virus. The most common symptoms include: fever, fatigue, cough, body aches, headaches, sore throat, nasal congestion, shortness of breath, nausea, vomiting, diarrhea, changes in smell and/or taste.    COURSE OF ILLNESS Some patients may begin with mild disease which can progress quickly into critical symptoms. If your symptoms are worsening please call ahead to the Emergency Department and proceed there for further treatment. Recovery time appears to be roughly 1-2 weeks for mild symptoms and 3-6 weeks for severe disease.   GO IMMEDIATELY TO ER FOR FEVER YOU ARE UNABLE TO GET DOWN WITH TYLENOL, BREATHING PROBLEMS, CHEST PAIN, FATIGUE, LETHARGY, INABILITY TO EAT OR DRINK, ETC  QUARANTINE AND ISOLATION: To help decrease  the spread of COVID-19 please remain isolated  if you have COVID infection or are highly suspected to have COVID infection. This means -stay home and isolate to one room in the home if you live with others. Do not share a bed or bathroom with others while ill, sanitize and wipe down all countertops and keep common areas clean and disinfected. Stay home for 5 days. If you have no symptoms or your symptoms are resolving after 5 days, you can leave your house. Continue to wear a mask around others for 5 additional days. If you have been in close contact (within 6 feet) of someone diagnosed with COVID 19, you are advised to quarantine in your home for 14 days as symptoms can develop anywhere from 2-14 days after exposure to the virus. If you develop symptoms, you  must isolate.  Most current guidelines for COVID after exposure -unvaccinated: isolate 5 days and strict mask use x 5 days. Test on day 5 is possible -vaccinated: wear mask x 10 days if symptoms do not develop -You do not necessarily need to be tested for COVID if you have + exposure and  develop symptoms. Just isolate at home x10 days from symptom onset During this global pandemic, CDC advises to practice social distancing, try to stay at least 2ft away from others at all times. Wear a face covering. Wash and sanitize your hands regularly and avoid going anywhere that is not necessary.  KEEP IN MIND THAT THE COVID TEST IS NOT 100% ACCURATE AND YOU SHOULD STILL DO EVERYTHING TO PREVENT POTENTIAL SPREAD OF VIRUS TO OTHERS (WEAR MASK, WEAR GLOVES, WASH HANDS AND SANITIZE REGULARLY). IF INITIAL TEST IS NEGATIVE, THIS MAY NOT MEAN YOU ARE DEFINITELY NEGATIVE. MOST ACCURATE TESTING IS DONE 5-7 DAYS AFTER EXPOSURE.   It is not advised by CDC to get re-tested after receiving a positive COVID test since you can still test positive for weeks to months after you have already cleared the virus.   *If you have not been vaccinated for COVID, I strongly suggest you consider getting vaccinated as long as  there are no contraindications.       ED Prescriptions   None    PDMP not reviewed this encounter.   Shirlee Latch, PA-C 01/28/21 1541    Eusebio Friendly B, PA-C 01/28/21 1550

## 2021-01-28 NOTE — ED Triage Notes (Signed)
Pt here with C/O cough, nasal congestion, started Tuesday. Wants to check for Covid. States he feels much better today.

## 2021-01-28 NOTE — Discharge Instructions (Addendum)

## 2021-01-29 LAB — SARS CORONAVIRUS 2 (TAT 6-24 HRS): SARS Coronavirus 2: POSITIVE — AB

## 2021-08-14 ENCOUNTER — Encounter: Payer: Self-pay | Admitting: Emergency Medicine

## 2021-08-14 ENCOUNTER — Ambulatory Visit
Admission: EM | Admit: 2021-08-14 | Discharge: 2021-08-14 | Disposition: A | Discharge status: Home / Self Care | Payer: 59 | Attending: Emergency Medicine | Admitting: Emergency Medicine

## 2021-08-14 ENCOUNTER — Other Ambulatory Visit: Payer: Self-pay

## 2021-08-14 DIAGNOSIS — M109 Gout, unspecified: Secondary | ICD-10-CM

## 2021-08-14 MED ORDER — PREDNISONE 20 MG PO TABS: 40.0000 mg | ORAL_TABLET | Freq: Every day | ORAL | 0 refills | Status: DC

## 2021-08-14 NOTE — ED Provider Notes (Signed)
?[facility] ? ? ? ?CSN: [id number] ?Arrival date & time: 08/14/21  1031 ? ? ?  ? ?History   ?Chief Complaint ?Chief Complaint  ?Patient presents with  ? Toe Pain  ?  Left big toe  ? ? ?HPI ?Robert Bush is a 46 y.o. male.  ? ?Patient presents with pain, swelling and erythema to the left great toe for 3 days, noticed erythema beginning today.  Painful to bear weight.  Range of motion intact.  denies numbness, tingling, prior injury, trauma or precipitating event.  Has attempted use of tramadol which has been ineffective.  Pain has been interfering with sleep.  Familial history of gout.  Endorses that over Easter weekend he did eat an increased amount of of pork and had been drinking beer.   ? ?Past Medical History:  ?Diagnosis Date  ? ADD (attention deficit disorder)   ? Arthritis   ? Hepatitis C 2000  ? FROM BLOOD TRANSFUSION AS A BABY AFTER COLON SURGERY   ? Hirschsprung's disease   ? 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  ? ? ?There are no problems to display for this patient. ? ? ?Past Surgical History:  ?Procedure Laterality Date  ? COLON SURGERY    ? Hirschprung's corrective surgery  ? ESOPHAGOGASTRODUODENOSCOPY (EGD) WITH PROPOFOL N/A 02/03/2019  ? Procedure: ESOPHAGOGASTRODUODENOSCOPY (EGD) WITH PROPOFOL;  Surgeon: Christena Deem, MD;  Location: [facility] ENDOSCOPY;  Service: Endoscopy;  Laterality: N/A;  ? KNEE ARTHROSCOPY Left 03/2018  ? SHOULDER ARTHROSCOPY WITH ROTATOR CUFF REPAIR Right 05/23/2018  ? Procedure: SHOULDER ARTHROSCOPY WITH MINI OPEN ROTATOR CUFF REPAIR;  Surgeon: Juanell Fairly, MD;  Location: [facility] ORS;  Service: Orthopedics;  Laterality: Right;  ? ? ? ? ? ?Home Medications   ? ?Prior to Admission medications   ?Medication Sig Start Date End Date Taking? Authorizing Provider  ?ondansetron (ZOFRAN) 4 MG tablet Take 1 tablet (4 mg total) by mouth every 8 (eight) hours as needed for nausea or vomiting. 05/23/18   Juanell Fairly, MD  ?oxyCODONE (OXY IR/ROXICODONE) 5 MG immediate release  tablet Take 1 tablet (5 mg total) by mouth every 4 (four) hours as needed. 05/23/18   Juanell Fairly, MD  ? ? ?Family History ?Family History  ?Problem Relation Age of Onset  ? Atrial fibrillation Mother   ? Heart failure Father   ? Diabetes Father   ? Kidney failure Father   ? ? ?Social History ?Social History  ? ?Tobacco Use  ? Smoking status: Former  ?  Types: Cigarettes  ? Smokeless tobacco: Never  ? Tobacco comments:  ?  Former social smoker  ?Vaping Use  ? Vaping Use: Never used  ?Substance Use Topics  ? Alcohol use: Yes  ?  Alcohol/week: 9.0 standard drinks  ?  Types: 9 Cans of beer per week  ?  Comment: occasionally  ? Drug use: Never  ? ? ? ?Allergies   ?Patient has no known allergies. ? ? ?Review of Systems ?Review of Systems ?Defer to HPI  ? ? ?Physical Exam ?Triage Vital Signs ?ED Triage Vitals  ?Enc Vitals Group  ?   BP 08/14/21 1056 (!) 136/93  ?   Pulse Rate 08/14/21 1056 68  ?   Resp 08/14/21 1056 15  ?   Temp 08/14/21 1056 97.9 ?F (36.6 ?C)  ?   Temp Source 08/14/21 1056 Oral  ?   SpO2 08/14/21 1056 98 %  ?   Weight 08/14/21 1053 (!) 330 lb (149.7 kg)  ?  Height 08/14/21 1053 6\' 3"  (1.905 m)  ?   Head Circumference --   ?   Peak Flow --   ?   Pain Score 08/14/21 1052 8  ?   Pain Loc --   ?   Pain Edu? --   ?   Excl. in GC? --   ? ?No data found. ? ?Updated Vital Signs ?BP (!) 136/93 (BP Location: Left Arm)   Pulse 68   Temp 97.9 ?F (36.6 ?C) (Oral)   Resp 15   Ht 6\' 3"  (1.905 m)   Wt (!) 330 lb (149.7 kg)   SpO2 98%   BMI 41.25 kg/m?  ? ?Visual Acuity ?Right Eye Distance:   ?Left Eye Distance:   ?Bilateral Distance:   ? ?Right Eye Near:   ?Left Eye Near:    ?Bilateral Near:    ? ?Physical Exam ?Constitutional:   ?   Appearance: Normal appearance.  ?Eyes:  ?   Extraocular Movements: Extraocular movements intact.  ?Pulmonary:  ?   Effort: Pulmonary effort is normal.  ?Feet:  ?   Comments: Tenderness, erythema and warmth present to the base of the metatarsal of the left great toe, sensation  intact, capillary refill less than 3, range of motion intact, 2+ pedal pulse ?Neurological:  ?   Mental Status: He is alert and oriented to person, place, and time. Mental status is at baseline.  ?Psychiatric:     ?   Mood and Affect: Mood normal.     ?   Behavior: Behavior normal.  ? ? ? ?UC Treatments / Results  ?Labs ?(all labs ordered are listed, but only abnormal results are displayed) ?Labs Reviewed - No data to display ? ?EKG ? ? ?Radiology ?No results found. ? ?Procedures ?Procedures (including critical care time) ? ?Medications Ordered in UC ?Medications - No data to display ? ?Initial Impression / Assessment and Plan / UC Course  ?I have reviewed the triage vital signs and the nursing notes. ? ?Pertinent labs & imaging results that were available during my care of the patient were reviewed by me and considered in my medical decision making (see chart for details). ? 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 ?Prednisone 40 mg burst prescribed for management, recommended over-the-counter Tylenol and patient's personal prescription of tramadol for additional comfort, recommended tailoring diet to prevent reoccurrence or further irritation, given written handout on foods to avoid, may use ice or heat over the affected area in 10 to 15-minute intervals, activity as tolerated, may follow-up with urgent care or primary care doctor for reoccurring or persistent ?Final Clinical Impressions(s) / UC Diagnoses  ? ?Final diagnoses:  ?None  ? ?Discharge Instructions   ?None ?  ? ?ED Prescriptions   ?None ?  ? ?PDMP not reviewed this encounter. ?  ?08/16/21, NP ?08/14/21 1135 ? ?

## 2021-08-14 NOTE — ED Triage Notes (Signed)
Patient c/o left big toe pain that started 3 days ago.  Patient denies injury or fall.  ?

## 2021-08-14 NOTE — Discharge Instructions (Signed)
? ? ?  Today you are being treated for gout ? ?Take prednisone every morning for the next 5 days to reduce inflammation which in turn should help with your pain, you may use Tylenol or tramadol as needed for additional comfort ? ?May use ice or heat over the affected area 10 to 15-minute intervals ?Raise the painful joint above the level of your heart as often as you can. ?Rest the joint as much as possible. If the joint is in your leg, you may be given crutches. ?Eat a low-purine diet. Avoid foods and drinks such as: ?Liver. ?Kidney. ?Anchovies. ?Asparagus. ?Herring. ?Mushrooms. ?Mussels. ?Beer. ?

## 2022-05-06 ENCOUNTER — Encounter: Payer: Self-pay | Admitting: Emergency Medicine

## 2022-05-06 ENCOUNTER — Ambulatory Visit
Admission: EM | Admit: 2022-05-06 | Discharge: 2022-05-06 | Disposition: A | Discharge status: Home / Self Care | Payer: 59 | Attending: Family Medicine | Admitting: Family Medicine

## 2022-05-06 DIAGNOSIS — J988 Other specified respiratory disorders: Secondary | ICD-10-CM | POA: Diagnosis not present

## 2022-05-06 MED ORDER — AMOXICILLIN-POT CLAVULANATE 875-125 MG PO TABS: 1.0000 | ORAL_TABLET | Freq: Two times a day (BID) | ORAL | 0 refills | Status: DC

## 2022-05-06 MED ORDER — MOXIFLOXACIN HCL 0.5 % OP SOLN: 1.0000 [drp] | Freq: Three times a day (TID) | OPHTHALMIC | 0 refills | Status: AC

## 2022-05-06 NOTE — ED Provider Notes (Signed)
[facility]    CSN: [id number] Arrival date & time: 05/06/22  1445      History   Chief Complaint Chief Complaint  Patient presents with   Cough    HPI 47 year old male presents with persistent respiratory symptoms.  Patient reports that he has been sick since the middle of December.  Initially improved and then worsened again over the past week.  He reports eye matting and crusting.  Reports cough and congestion.  No fever.  No shortness of breath.  No relieving factors.  No other complaints.  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   Past Surgical History:  Procedure Laterality Date   COLON SURGERY     Hirschprung's corrective surgery   ESOPHAGOGASTRODUODENOSCOPY (EGD) WITH PROPOFOL N/A 02/03/2019   Procedure: ESOPHAGOGASTRODUODENOSCOPY (EGD) WITH PROPOFOL;  Surgeon: Christena Deem, MD;  Location: [facility] ENDOSCOPY;  Service: Endoscopy;  Laterality: N/A;   KNEE ARTHROSCOPY Left 03/2018   SHOULDER ARTHROSCOPY WITH ROTATOR CUFF REPAIR Right 05/23/2018   Procedure: SHOULDER ARTHROSCOPY WITH MINI OPEN ROTATOR CUFF REPAIR;  Surgeon: Juanell Fairly, MD;  Location: [facility] ORS;  Service: Orthopedics;  Laterality: Right;       Home Medications    Prior to Admission medications   Medication Sig Start Date End Date Taking? Authorizing Provider  amoxicillin-clavulanate (AUGMENTIN) 875-125 MG tablet Take 1 tablet by mouth every 12 (twelve) hours. 05/06/22  Yes Christhoper Busbee G, DO  moxifloxacin (VIGAMOX) 0.5 % ophthalmic solution Place 1 drop into both eyes 3 (three) times daily for 7 days. 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   Review of Systems Review of Systems Per HPI  Physical Exam Triage Vital Signs ED Triage Vitals  Enc Vitals Group     BP 05/06/22 1512 129/89     Pulse Rate 05/06/22 1512 73     Resp 05/06/22 1512 15     Temp 05/06/22 1512 98.4 F (36.9 C)     Temp Source 05/06/22 1512 Oral     SpO2 05/06/22 1512 98 %     Weight 05/06/22 1510 (!) 330 lb 0.5 oz (149.7 kg)     Height 05/06/22 1510 6\' 3"  (1.905 m)     Head Circumference --      Peak Flow --      Pain Score 05/06/22 1510 0     Pain Loc --      Pain Edu? --      Excl. in GC? --    Updated Vital Signs  BP 129/89 (BP Location: Left Arm)   Pulse 73   Temp 98.4 F (36.9 C) (Oral)   Resp 15   Ht 6\' 3"  (1.905 m)   Wt (!) 149.7 kg   SpO2 98%   BMI 41.25 kg/m   Visual Acuity Right Eye Distance:   Left Eye Distance:   Bilateral Distance:    Right Eye Near:   Left Eye Near:    Bilateral Near:     Physical Exam Vitals and nursing note reviewed.  Constitutional:      General: He is not in acute distress.    Appearance: Normal appearance.  HENT:     Head: Normocephalic and atraumatic.     Nose: Congestion present.     Mouth/Throat:     Pharynx: Oropharynx is clear.  Eyes:     Comments: Mild conjunctival injection bilaterally.  Cardiovascular:     Rate and Rhythm: Normal rate and regular rhythm.  Pulmonary:     Effort: Pulmonary effort is normal.     Breath sounds: Normal breath sounds. No wheezing, rhonchi or rales.  Neurological:     Mental Status: He is alert.      UC Treatments /  Results  Labs (all labs ordered are listed, but only abnormal results are displayed) Labs Reviewed - No data to display  EKG   Radiology No results found.  Procedures Procedures (including critical care time)  Medications Ordered in UC Medications - No data to display  Initial Impression / Assessment and Plan / UC Course  I have reviewed the triage vital signs and the nursing notes.  Pertinent labs & imaging results that were available during my care of the patient were reviewed by me and considered in my medical decision making (see chart for details).    47 year old male presents with persistent respiratory symptoms.  Given duration of illness, concern for bacterial infection.  Placing on Augmentin.  Vigamox for conjunctivitis.  Final Clinical Impressions(s) / UC Diagnoses   Final diagnoses:  Respiratory infection     Discharge Instructions      Antibiotics as prescribed.  Take care  Dr. Lacinda Axon     ED Prescriptions     Medication Sig Dispense Auth. Provider   amoxicillin-clavulanate (AUGMENTIN) 875-125 MG tablet Take 1 tablet by mouth every 12 (twelve) hours. 14 tablet Bernadine Melecio G, DO   moxifloxacin (VIGAMOX) 0.5 % ophthalmic solution Place 1 drop into both eyes 3 (three) times daily for 7 days. 3 mL Coral Spikes, DO      PDMP not reviewed this encounter.   Coral Spikes, [place] 05/06/22 [phone]

## 2022-05-06 NOTE — ED Triage Notes (Signed)
Patient c/o cough and chest congestion for a month.  Patient denies recent fevers.  Patient reports watery drainage in both eyes for the past 2-3 days.

## 2022-05-06 NOTE — Discharge Instructions (Signed)
Antibiotics as prescribed.  Take care  Dr. Stephnie Parlier  

## 2022-07-27 ENCOUNTER — Other Ambulatory Visit: Payer: Self-pay | Admitting: Gastroenterology

## 2022-07-27 DIAGNOSIS — K7402 Hepatic fibrosis, advanced fibrosis: Secondary | ICD-10-CM

## 2022-08-07 ENCOUNTER — Other Ambulatory Visit: Payer: Self-pay | Admitting: Gastroenterology

## 2022-08-07 DIAGNOSIS — K7402 Hepatic fibrosis, advanced fibrosis: Secondary | ICD-10-CM

## 2022-08-08 ENCOUNTER — Ambulatory Visit
Admission: RE | Admit: 2022-08-08 | Discharge: 2022-08-08 | Disposition: A | Discharge status: Home / Self Care | Payer: 59 | Source: Ambulatory Visit | Attending: Gastroenterology | Admitting: Gastroenterology

## 2022-08-08 DIAGNOSIS — K7402 Hepatic fibrosis, advanced fibrosis: Secondary | ICD-10-CM | POA: Insufficient documentation

## 2022-08-08 DIAGNOSIS — K760 Fatty (change of) liver, not elsewhere classified: Secondary | ICD-10-CM | POA: Insufficient documentation

## 2022-11-10 ENCOUNTER — Encounter: Payer: Self-pay | Admitting: *Deleted

## 2022-11-13 ENCOUNTER — Ambulatory Visit: Payer: 59 | Admitting: General Practice

## 2022-11-13 ENCOUNTER — Encounter
Admission: RE | Disposition: A | Discharge status: Home / Self Care | Payer: Self-pay | Source: Home / Self Care | Attending: Gastroenterology

## 2022-11-13 ENCOUNTER — Ambulatory Visit
Admission: RE | Admit: 2022-11-13 | Discharge: 2022-11-13 | Disposition: A | Discharge status: Home / Self Care | Payer: 59 | Attending: Gastroenterology | Admitting: Gastroenterology

## 2022-11-13 DIAGNOSIS — Z87891 Personal history of nicotine dependence: Secondary | ICD-10-CM | POA: Diagnosis not present

## 2022-11-13 DIAGNOSIS — B1920 Unspecified viral hepatitis C without hepatic coma: Secondary | ICD-10-CM | POA: Insufficient documentation

## 2022-11-13 DIAGNOSIS — K7460 Unspecified cirrhosis of liver: Secondary | ICD-10-CM | POA: Insufficient documentation

## 2022-11-13 DIAGNOSIS — Z1211 Encounter for screening for malignant neoplasm of colon: Secondary | ICD-10-CM | POA: Diagnosis not present

## 2022-11-13 DIAGNOSIS — K640 First degree hemorrhoids: Secondary | ICD-10-CM | POA: Insufficient documentation

## 2022-11-13 DIAGNOSIS — K5730 Diverticulosis of large intestine without perforation or abscess without bleeding: Secondary | ICD-10-CM | POA: Diagnosis not present

## 2022-11-13 DIAGNOSIS — K760 Fatty (change of) liver, not elsewhere classified: Secondary | ICD-10-CM | POA: Insufficient documentation

## 2022-11-13 DIAGNOSIS — Z87738 Personal history of other specified (corrected) congenital malformations of digestive system: Secondary | ICD-10-CM | POA: Insufficient documentation

## 2022-11-13 HISTORY — PX: COLONOSCOPY WITH PROPOFOL: SHX5780

## 2022-11-13 HISTORY — PX: ESOPHAGOGASTRODUODENOSCOPY (EGD) WITH PROPOFOL: SHX5813

## 2022-11-13 SURGERY — COLONOSCOPY WITH PROPOFOL
Anesthesia: General

## 2022-11-13 MED ORDER — MIDAZOLAM HCL 2 MG/2ML IJ SOLN
INTRAMUSCULAR | Status: DC | PRN
  Administered 2022-11-13: 2 mg via INTRAVENOUS

## 2022-11-13 MED ORDER — DEXMEDETOMIDINE HCL IN NACL 80 MCG/20ML IV SOLN
INTRAVENOUS | Status: AC
  Filled 2022-11-13: qty 20

## 2022-11-13 MED ORDER — PROPOFOL 500 MG/50ML IV EMUL
INTRAVENOUS | Status: DC | PRN
  Administered 2022-11-13: 125 ug/kg/min via INTRAVENOUS

## 2022-11-13 MED ORDER — PROPOFOL 10 MG/ML IV BOLUS
INTRAVENOUS | Status: DC | PRN
Start: 2022-11-13 — End: 2022-11-13
  Administered 2022-11-13 (×2): 50 mg via INTRAVENOUS
  Administered 2022-11-13: 30 mg via INTRAVENOUS
  Administered 2022-11-13: 120 mg via INTRAVENOUS

## 2022-11-13 MED ORDER — PROPOFOL 1000 MG/100ML IV EMUL
INTRAVENOUS | Status: AC
  Filled 2022-11-13: qty 100

## 2022-11-13 MED ORDER — LIDOCAINE HCL (CARDIAC) PF 100 MG/5ML IV SOSY
PREFILLED_SYRINGE | INTRAVENOUS | Status: DC | PRN
  Administered 2022-11-13: 100 mg via INTRAVENOUS

## 2022-11-13 MED ORDER — MIDAZOLAM HCL 2 MG/2ML IJ SOLN
INTRAMUSCULAR | Status: AC
  Filled 2022-11-13: qty 2

## 2022-11-13 MED ORDER — SODIUM CHLORIDE 0.9 % IV SOLN: INTRAVENOUS | Status: DC

## 2022-11-13 MED ORDER — LIDOCAINE HCL (PF) 2 % IJ SOLN
INTRAMUSCULAR | Status: AC
  Filled 2022-11-13: qty 5

## 2022-11-13 MED ORDER — GLYCOPYRROLATE 0.2 MG/ML IJ SOLN
INTRAMUSCULAR | Status: DC | PRN
  Administered 2022-11-13: .2 mg via INTRAVENOUS

## 2022-11-13 MED ORDER — GLYCOPYRROLATE 0.2 MG/ML IJ SOLN
INTRAMUSCULAR | Status: AC
  Filled 2022-11-13: qty 1

## 2022-11-13 MED ORDER — DEXMEDETOMIDINE HCL IN NACL 80 MCG/20ML IV SOLN
INTRAVENOUS | Status: DC | PRN
  Administered 2022-11-13 (×2): 4 ug via INTRAVENOUS

## 2022-11-13 NOTE — Anesthesia Preprocedure Evaluation (Signed)
Anesthesia Evaluation  Patient identified by MRN, date of birth, ID band Patient awake    Reviewed: Allergy & Precautions, NPO status , Patient's Chart, lab work & pertinent test results  History of Anesthesia Complications Negative for: history of anesthetic complications  Airway Mallampati: III  TM Distance: >3 FB Neck ROM: full    Dental  (+) Chipped, Dental Advidsory Given   Pulmonary neg pulmonary ROS, neg sleep apnea, neg COPD, Not current smoker, former smoker   Pulmonary exam normal        Cardiovascular (-) hypertension(-) Past MI and (-) CHF negative cardio ROS Normal cardiovascular exam(-) dysrhythmias (-) Valvular Problems/Murmurs     Neuro/Psych neg Seizures negative neurological ROS  negative psych ROS   GI/Hepatic negative GI ROS, Neg liver ROS,neg GERD  ,,(+) Cirrhosis       , Hepatitis -, C  Endo/Other  negative endocrine ROSneg diabetes    Renal/GU negative Renal ROS  negative genitourinary   Musculoskeletal   Abdominal   Peds  Hematology negative hematology ROS (+)   Anesthesia Other Findings Patient with alpha 1 antitrypsin disease. States he does not effect his daily life.   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  Past Surgical History: No date: COLON SURGERY     Comment:  Hirschprung's corrective surgery 02/03/2019: ESOPHAGOGASTRODUODENOSCOPY (EGD) WITH PROPOFOL; N/A     Comment:  Procedure: ESOPHAGOGASTRODUODENOSCOPY (EGD) WITH               PROPOFOL;  Surgeon: Christena Deem, MD;  Location:               [facility] ENDOSCOPY;  Service: Endoscopy;  Laterality: N/A; 03/2018: KNEE ARTHROSCOPY; Left 05/23/2018: SHOULDER ARTHROSCOPY WITH ROTATOR CUFF REPAIR; Right     Comment:  Procedure:  SHOULDER ARTHROSCOPY WITH MINI OPEN ROTATOR               CUFF REPAIR;  Surgeon: Juanell Fairly, MD;  Location:               [facility] ORS;  Service: Orthopedics;  Laterality: Right;     Reproductive/Obstetrics negative OB ROS                             Anesthesia Physical Anesthesia Plan  ASA: 2  Anesthesia Plan: General   Post-op Pain Management: Minimal or no pain anticipated   Induction: Intravenous  PONV Risk Score and Plan: 3 and Propofol infusion, TIVA and Ondansetron  Airway Management Planned: Nasal Cannula  Additional Equipment: None  Intra-op Plan:   Post-operative Plan:   Informed Consent: I have reviewed the patients History and Physical, chart, labs and discussed the procedure including the risks, benefits and alternatives for the proposed anesthesia with the patient or authorized representative who has indicated his/her understanding and acceptance.     Dental advisory given  Plan Discussed with: CRNA and Surgeon  Anesthesia Plan Comments: (Discussed risks of anesthesia with patient, including possibility of difficulty with spontaneous ventilation under anesthesia necessitating airway intervention, PONV, and rare risks such as cardiac or respiratory or neurological events, and allergic reactions. Discussed the role of CRNA in patient's perioperative care. Patient understands.)        Anesthesia Quick Evaluation

## 2022-11-13 NOTE — Op Note (Signed)
[facility]astroenter[facility] Patient Name: Robert Bush Procedure Date: 11/13/2022 7:51 AM MRN: [id number] Account #: [id number] Date of Birth: [birth year] Admit Type: Outpatient Age: 47 Room: [facility] ENDO ROOM 3 Gender: Male Note Status: Finalized Instrument Name: Prentice Docker [id number] Procedure:             Colonoscopy Indications:           Screening for colorectal malignant neoplasm Providers:             Eather Colas MD, MD Referring MD:          Wilford Corner (Referring MD) Medicines:             Monitored Anesthesia Care Complications:         No immediate complications. Procedure:             Pre-Anesthesia Assessment:                        - Prior to the procedure, a History and Physical was                         performed, and patient medications and allergies were                         reviewed. The patient is competent. The risks and                         benefits of the procedure and the sedation options and                         risks were discussed with the patient. All questions                         were answered and informed consent was obtained.                         Patient identification and proposed procedure were                         verified by the physician, the nurse, the                         anesthesiologist, the anesthetist and the technician                         in the endoscopy suite. Mental Status Examination:                         alert and oriented. Airway Examination: normal                         oropharyngeal airway and neck mobility. Respiratory                         Examination: clear to auscultation. CV Examination:                         normal. Prophylactic Antibiotics: The patient does not  require prophylactic antibiotics. Prior                         Anticoagulants: The patient has taken no anticoagulant                         or antiplatelet agents. ASA Grade  Assessment: II - A                         patient with mild systemic disease. After reviewing                         the risks and benefits, the patient was deemed in                         satisfactory condition to undergo the procedure. The                         anesthesia plan was to use monitored anesthesia care                         (MAC). Immediately prior to administration of                         medications, the patient was re-assessed for adequacy                         to receive sedatives. The heart rate, respiratory                         rate, oxygen saturations, blood pressure, adequacy of                         pulmonary ventilation, and response to care were                         monitored throughout the procedure. The physical                         status of the patient was re-assessed after the                         procedure.                        After obtaining informed consent, the colonoscope was                         passed under direct vision. Throughout the procedure,                         the patient's blood pressure, pulse, and oxygen                         saturations were monitored continuously. The                         Colonoscope was introduced through the anus and  advanced to the the terminal ileum. The colonoscopy                         was performed without difficulty. The patient                         tolerated the procedure well. The quality of the bowel                         preparation was good. The terminal ileum, ileocecal                         valve, appendiceal orifice, and rectum were                         photographed. Findings:      The perianal and digital rectal examinations were normal.      The terminal ileum appeared normal.      A single small-mouthed diverticulum was found in the rectum. Rectal       diverticulum are relatively rare but could be explained by history of        hirschsprung's disease.      Internal hemorrhoids were found during retroflexion. The hemorrhoids       were Grade I (internal hemorrhoids that do not prolapse).      The exam was otherwise without abnormality on direct and retroflexion       views. Impression:            - The examined portion of the ileum was normal.                        - Diverticulosis in the rectum.                        - Internal hemorrhoids.                        - The examination was otherwise normal on direct and                         retroflexion views.                        - No specimens collected. Recommendation:        - Discharge patient to home.                        - Resume previous diet.                        - Continue present medications.                        - Repeat colonoscopy in 10 years for screening                         purposes.                        - Return to referring physician as previously  scheduled. Procedure Code(s):     --- Professional ---                        R5188, Colorectal cancer screening; colonoscopy on                         individual not meeting criteria for high risk Diagnosis Code(s):     --- Professional ---                        Z12.11, Encounter for screening for malignant neoplasm                         of colon                        K64.0, First degree hemorrhoids                        K57.30, Diverticulosis of large intestine without                         perforation or abscess without bleeding CPT copyright 2022 American Medical Association. All rights reserved. The codes documented in this report are preliminary and upon coder review may  be revised to meet current compliance requirements. Eather Colas MD, MD 11/13/2022 8:21:34 AM Number of Addenda: 0 Note Initiated On: 11/13/2022 7:51 AM Scope Withdrawal Time: 0 hours 5 minutes 36 seconds  Total Procedure Duration: 0 hours 7 minutes 28 seconds  Estimated  Blood Loss:  Estimated blood loss: none.      [facility]

## 2022-11-13 NOTE — H&P (Signed)
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 History of surgery as an infant for hirschsprung. No blood thinners. No family history of GI malignancies.    Current Facility-Administered Medications:    0.9 %  sodium chloride infusion, , Intravenous, Continuous, Torey Reinard, Rossie Muskrat, MD, Last Rate: 20 mL/hr at 11/13/22 0729, New Bag at 11/13/22 0729  Medications Prior to Admission  Medication Sig Dispense Refill Last Dose   amoxicillin-clavulanate (AUGMENTIN) 875-125 MG tablet Take 1 tablet by mouth every 12 (twelve) hours. (Patient not taking: Reported on 11/13/2022) 14 tablet 0 Not Taking     No Known Allergies   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    Review of systems:  Otherwise negative.    Physical Exam  Gen: Alert, oriented. Appears stated age.  HEENT: PERRLA. Lungs: No respiratory distress CV: RRR Abd: soft, benign, no masses Ext: No edema    Planned procedures: Proceed with EGD/colonoscopy. The patient understands the nature of the planned procedure, indications, risks, alternatives and potential complications including but not limited to bleeding, infection, perforation, damage to internal organs and possible oversedation/side effects from anesthesia. The patient agrees and gives consent to proceed.  Please refer to procedure notes for findings, recommendations and patient disposition/instructions.     Regis Bill, MD [facility]

## 2022-11-13 NOTE — Transfer of Care (Signed)
Immediate Anesthesia Transfer of Care Note  Patient: Robert Bush.  Procedure(s) Performed: COLONOSCOPY WITH PROPOFOL ESOPHAGOGASTRODUODENOSCOPY (EGD) WITH PROPOFOL  Patient Location: PACU  Anesthesia Type:General  Level of Consciousness: drowsy  Airway & Oxygen Therapy: Patient Spontanous Breathing and Patient connected to face mask oxygen  Post-op Assessment: Report given to RN and Post -op Vital signs reviewed and stable  Post vital signs: Reviewed and stable  Last Vitals:  Vitals Value Taken Time  BP 93/65 11/13/22 0815  Temp 36.1 C 11/13/22 0814  Pulse 62 11/13/22 0816  Resp 16 11/13/22 0816  SpO2 98 % 11/13/22 0816  Vitals shown include unfiled device data.  Last Pain:  Vitals:   11/13/22 0814  TempSrc: Tympanic  PainSc: Asleep         Complications: No notable events documented.

## 2022-11-13 NOTE — Anesthesia Postprocedure Evaluation (Signed)
Anesthesia Post Note  Patient: Robert Bush.  Procedure(s) Performed: COLONOSCOPY WITH PROPOFOL ESOPHAGOGASTRODUODENOSCOPY (EGD) WITH PROPOFOL  Patient location during evaluation: Endoscopy Anesthesia Type: General Level of consciousness: awake and alert Pain management: pain level controlled Vital Signs Assessment: post-procedure vital signs reviewed and stable Respiratory status: spontaneous breathing, nonlabored ventilation, respiratory function stable and patient connected to nasal cannula oxygen Cardiovascular status: blood pressure returned to baseline and stable Postop Assessment: no apparent nausea or vomiting Anesthetic complications: no  No notable events documented.   Last Vitals:  Vitals:   11/13/22 0824 11/13/22 0834  BP: 101/71 113/86  Pulse: 63 73  Resp: 14 15  Temp:    SpO2: 96% 95%    Last Pain:  Vitals:   11/13/22 0834  TempSrc:   PainSc: 0-No pain                 Stephanie Coup

## 2022-11-13 NOTE — Op Note (Signed)
[name] Gastroenterology Patient Name: Robert Bush Procedure Date: 11/13/2022 7:52 AM MRN: [id number] Account #: [id number] Date of Birth: December 23, 1975 Admit Type: Outpatient Age: 47 Room: [facility] ENDO ROOM 3 Gender: Male Note Status: Finalized Instrument Name: Upper Endoscope [id number] Procedure:             Upper GI endoscopy Indications:           Screening procedure, Advanced Fibrosis Providers:             Eather Colas MD, MD Referring MD:          Wilford Corner (Referring MD) Medicines:             Monitored Anesthesia Care Complications:         No immediate complications. Procedure:             Pre-Anesthesia Assessment:                        - Prior to the procedure, a History and Physical was                         performed, and patient medications and allergies were                         reviewed. The patient is competent. The risks and                         benefits of the procedure and the sedation options and                         risks were discussed with the patient. All questions                         were answered and informed consent was obtained.                         Patient identification and proposed procedure were                         verified by the physician, the nurse, the                         anesthesiologist, the anesthetist and the technician                         in the endoscopy suite. Mental Status Examination:                         alert and oriented. Airway Examination: normal                         oropharyngeal airway and neck mobility. Respiratory                         Examination: clear to auscultation. CV Examination:                         normal. Prophylactic Antibiotics: The patient does not  require prophylactic antibiotics. Prior                         Anticoagulants: The patient has taken no anticoagulant                         or antiplatelet agents. ASA  Grade Assessment: II - A                         patient with mild systemic disease. After reviewing                         the risks and benefits, the patient was deemed in                         satisfactory condition to undergo the procedure. The                         anesthesia plan was to use monitored anesthesia care                         (MAC). Immediately prior to administration of                         medications, the patient was re-assessed for adequacy                         to receive sedatives. The heart rate, respiratory                         rate, oxygen saturations, blood pressure, adequacy of                         pulmonary ventilation, and response to care were                         monitored throughout the procedure. The physical                         status of the patient was re-assessed after the                         procedure.                        After obtaining informed consent, the endoscope was                         passed under direct vision. Throughout the procedure,                         the patient's blood pressure, pulse, and oxygen                         saturations were monitored continuously. The Endoscope                         was introduced through the mouth, and advanced to the  second part of duodenum. The upper GI endoscopy was                         accomplished without difficulty. The patient tolerated                         the procedure well. Findings:      The examined esophagus was normal.      The entire examined stomach was normal.      The examined duodenum was normal. Impression:            - Normal esophagus.                        - Normal stomach.                        - Normal examined duodenum.                        - No specimens collected. Recommendation:        - Discharge patient to home.                        - Resume previous diet.                        - Continue  present medications.                        - Return to referring physician as previously                         scheduled. Procedure Code(s):     --- Professional ---                        [phone], Esophagogastroduodenoscopy, flexible,                         transoral; diagnostic, including collection of                         specimen(s) by brushing or washing, when performed                         (separate procedure) Diagnosis Code(s):     --- Professional ---                        Z13.810, Encounter for screening for upper                         gastrointestinal disorder                        K74.60, Unspecified cirrhosis of liver CPT copyright 2022 American Medical Association. All rights reserved. The codes documented in this report are preliminary and upon coder review may  be revised to meet current compliance requirements. Eather Colas MD, MD 11/13/2022 8:18:22 AM Number of Addenda: 0 Note Initiated On: 11/13/2022 7:52 AM Estimated Blood Loss:  Estimated blood loss: none.      [facility]

## 2022-11-13 NOTE — Interval H&P Note (Signed)
History and Physical Interval Note:  11/13/2022 7:49 AM  Robert Bush.  has presented today for surgery, with the diagnosis of Advanced hepatic fibrosis.  The various methods of treatment have been discussed with the patient and family. After consideration of risks, benefits and other options for treatment, the patient has consented to  Procedure(s): COLONOSCOPY WITH PROPOFOL (N/A) ESOPHAGOGASTRODUODENOSCOPY (EGD) WITH PROPOFOL (N/A) as a surgical intervention.  The patient's history has been reviewed, patient examined, no change in status, stable for surgery.  I have reviewed the patient's chart and labs.  Questions were answered to the patient's satisfaction.     Regis Bill  Ok to proceed with EGD/Colonoscopy

## 2022-11-14 ENCOUNTER — Encounter: Payer: Self-pay | Admitting: Gastroenterology

## 2022-12-13 IMAGING — US US ABDOMEN LIMITED
1 series · 14 of 25 positions shown · non-contrast
Comparison: Ultrasound 06/28/2018

CLINICAL DATA: Stage III hepatic fibrosis.

EXAM:
ULTRASOUND ABDOMEN LIMITED RIGHT UPPER QUADRANT

[Series 1: us abdomen limited ruq (liver/gb) · 14 of 40 slices shown]
[im 1/40]
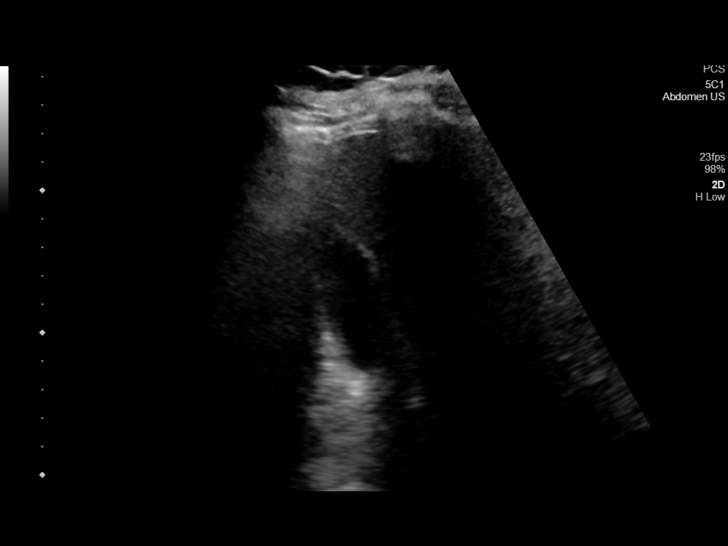
[im 4/40]
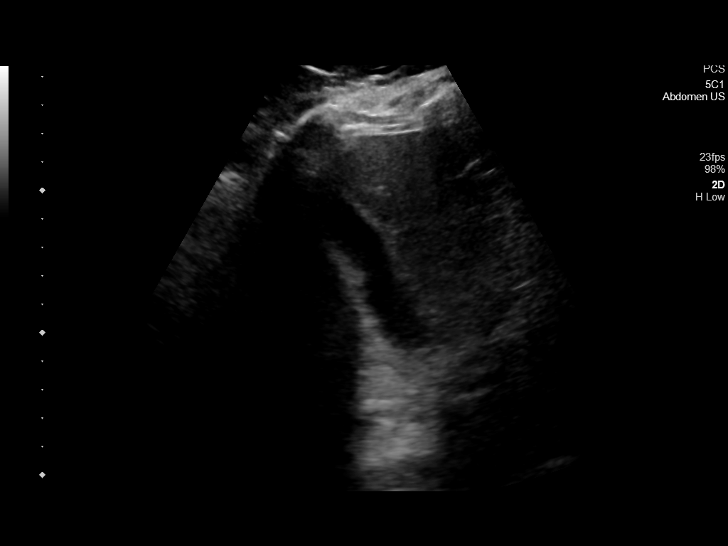
[im 7/40]
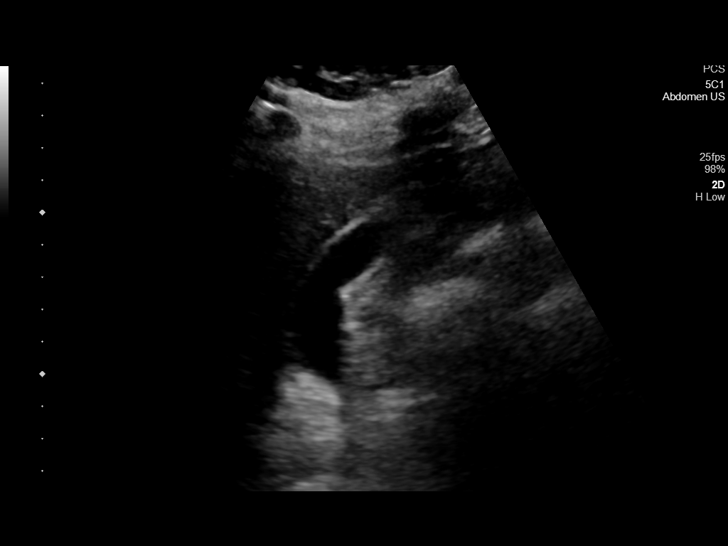
[im 10/40]
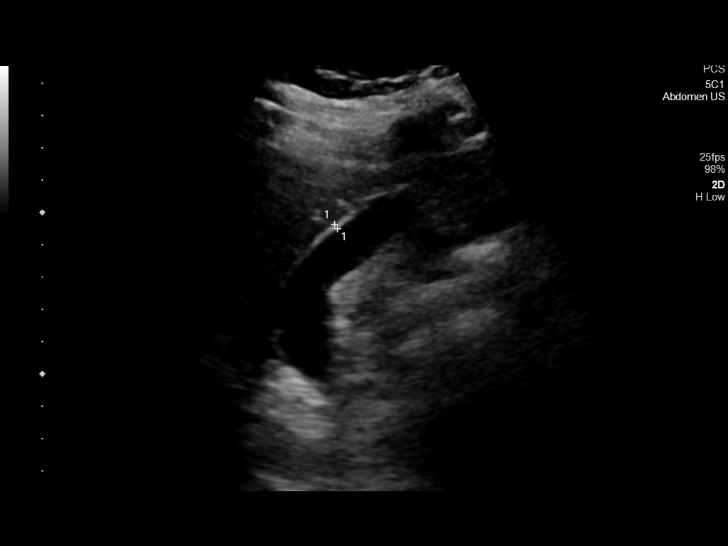
[im 14/40]
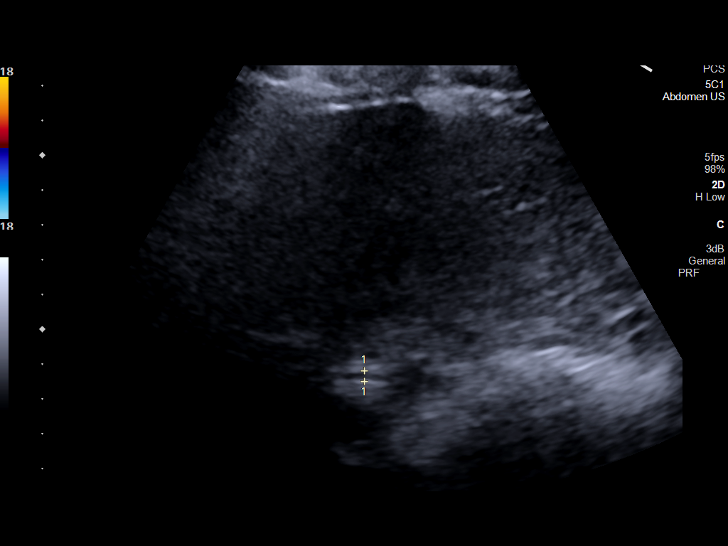
[im 15/40]
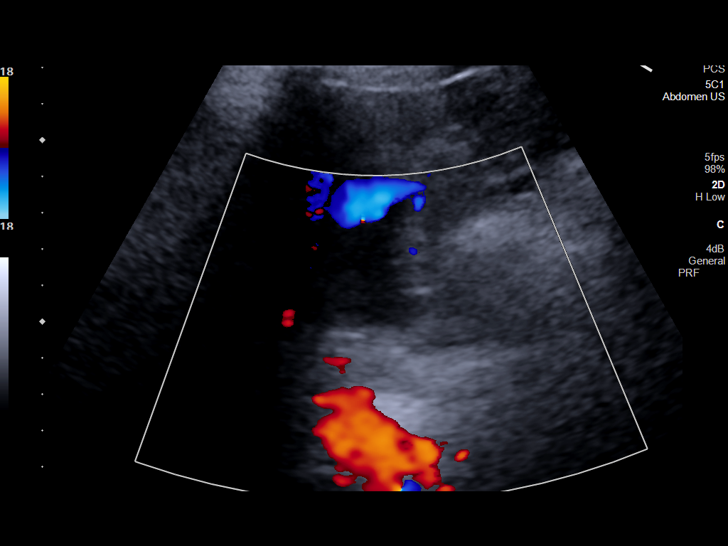
[im 18/40]
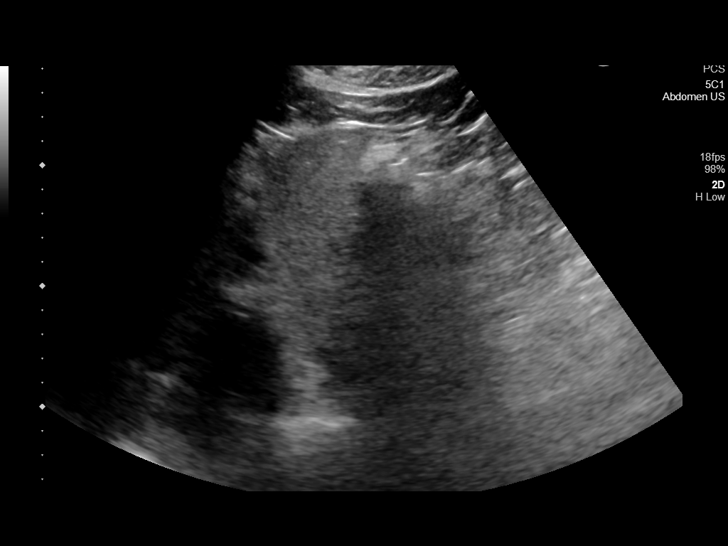
[im 22/40]
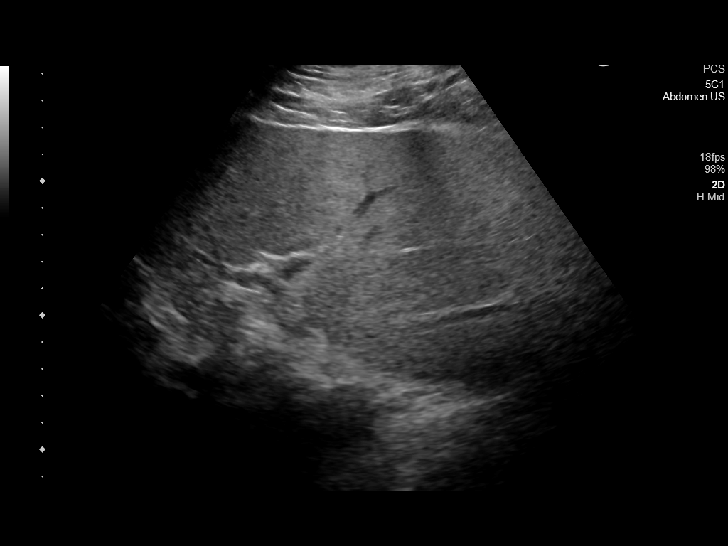
[im 25/40]
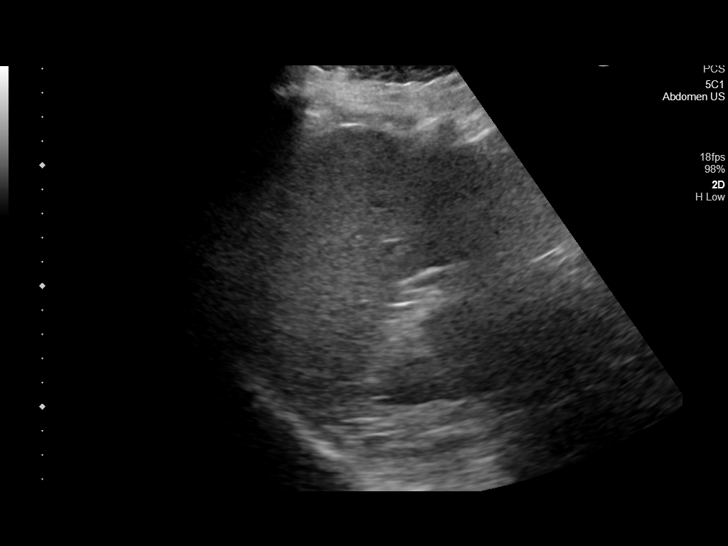
[im 27/40]
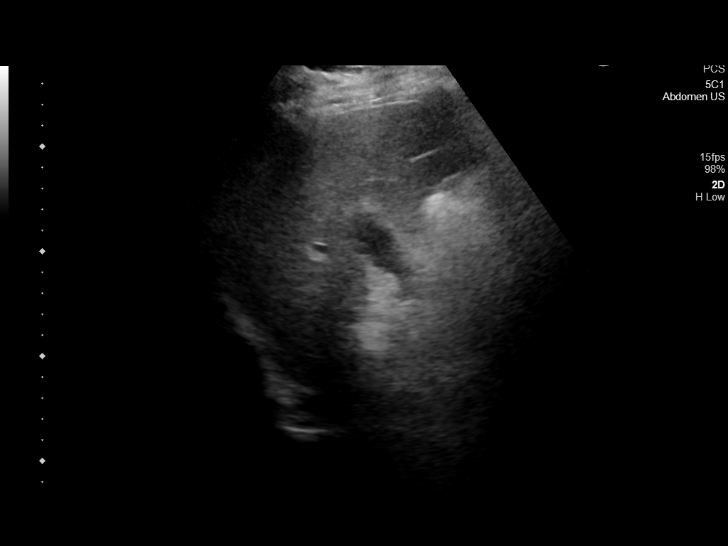
[im 30/40]
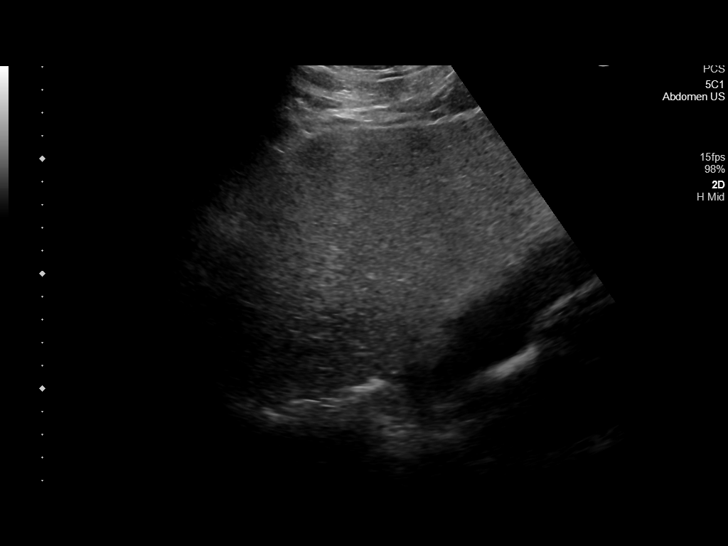
[im 33/40]
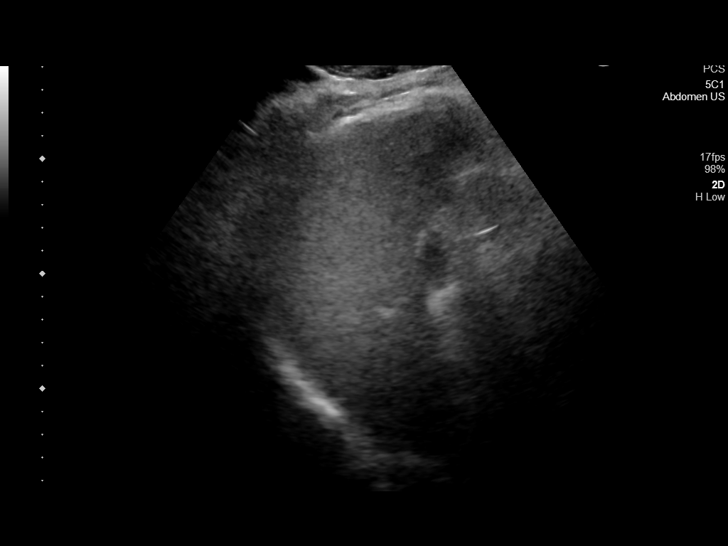
[im 36/40]
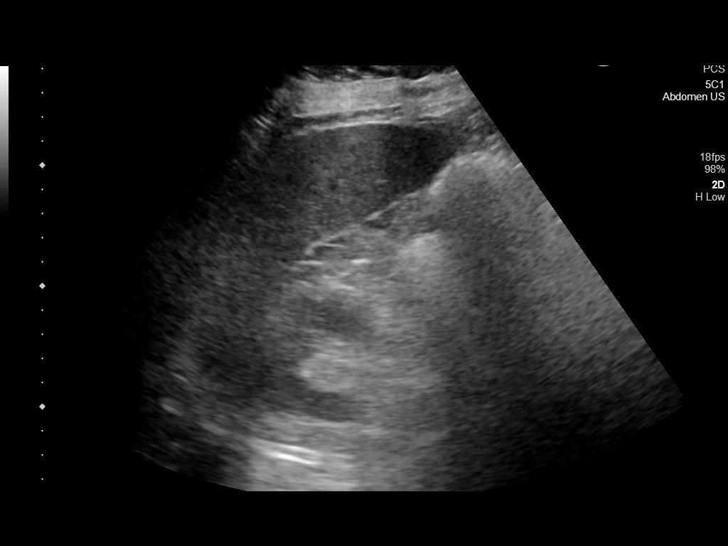
[im 40/40]
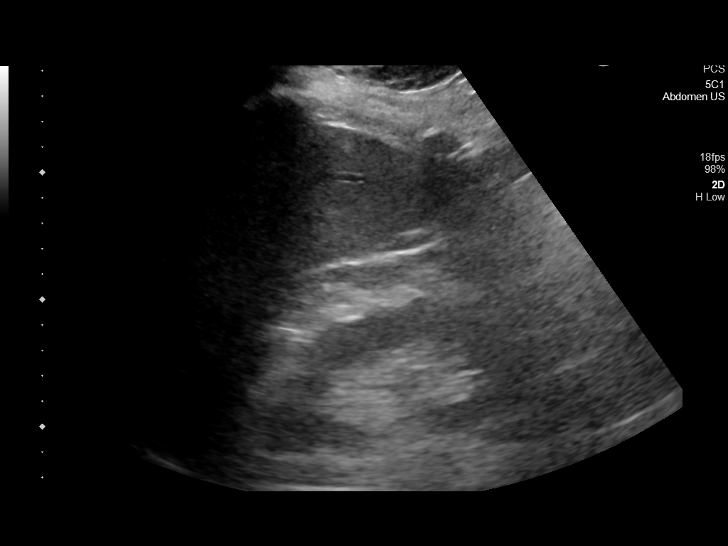

[14 of 25 positions shown; findings below may reference images not displayed]

FINDINGS: Gallbladder:

No gallstones or wall thickening visualized. No sonographic Murphy
sign noted by sonographer.

Common bile duct:

Diameter: 3 mm

Liver:

No focal lesion identified. Coarsened hepatic echotexture with
increased parenchymal echogenicity. Portal vein is patent on color
Doppler imaging with normal direction of blood flow towards the
liver.

Other: None.
IMPRESSION: Coarsened hepatic echotexture with increased parenchymal
echogenicity. Findings are nonspecific but can be seen in the
setting of cirrhosis. There are no obvious focal liver lesions.

## 2023-02-07 ENCOUNTER — Other Ambulatory Visit: Payer: Self-pay | Admitting: Gastroenterology

## 2023-02-07 DIAGNOSIS — K7400 Hepatic fibrosis, unspecified: Secondary | ICD-10-CM

## 2023-02-13 ENCOUNTER — Ambulatory Visit
Admission: RE | Admit: 2023-02-13 | Discharge: 2023-02-13 | Disposition: A | Discharge status: Home / Self Care | Payer: 59 | Source: Ambulatory Visit | Attending: Gastroenterology | Admitting: Gastroenterology

## 2023-02-13 DIAGNOSIS — K7400 Hepatic fibrosis, unspecified: Secondary | ICD-10-CM

## 2023-02-19 ENCOUNTER — Ambulatory Visit (INDEPENDENT_AMBULATORY_CARE_PROVIDER_SITE_OTHER): Payer: 59 | Admitting: Dermatology

## 2023-02-19 VITALS — BP 131/88

## 2023-02-19 DIAGNOSIS — L309 Dermatitis, unspecified: Secondary | ICD-10-CM

## 2023-02-19 DIAGNOSIS — L720 Epidermal cyst: Secondary | ICD-10-CM | POA: Diagnosis not present

## 2023-02-19 DIAGNOSIS — L209 Atopic dermatitis, unspecified: Secondary | ICD-10-CM

## 2023-02-19 MED ORDER — MOMETASONE FUROATE 0.1 % EX CREA: 1.0000 | TOPICAL_CREAM | CUTANEOUS | 2 refills | Status: AC

## 2023-02-19 NOTE — Progress Notes (Signed)
   New Patient Visit   Subjective  Robert Bush. is a 47 y.o. male who presents for the following: check raised spot R foot, ~ 10yrs, about the same size, flares some when does yard work, hx of xray in past, dry scaly spots on arms, trunk, itchy prn, not using anything, no hx of asthma, allergies, no hx of Eczema    New patient referral from Debbra Riding, PA-C  The following portions of the chart were reviewed this encounter and updated as appropriate: medications, allergies, medical history  Review of Systems:  No other skin or systemic complaints except as noted in HPI or Assessment and Plan.  Objective  Well appearing patient in no apparent distress; mood and affect are within normal limits.   A focused examination was performed of the following areas: Right foot, arms, trunk  Relevant exam findings are noted in the Assessment and Plan.    Assessment & Plan   CYST vs OTHER (possible pilomatricoma) Exam: Firm subcutaneous nodule, somewhat lobulated at R lat foot 2.5cm  Benign-appearing. Exam most consistent with an epidermal inclusion cyst. Discussed that a cyst is a benign growth that can grow over time and sometimes get irritated or inflamed. Recommend observation if it is not bothersome. Discussed option of surgical excision to remove it if it is growing, symptomatic, or other changes noted. Please call for new or changing lesions so they can be evaluated.  Recommend referral to podiatry for excision to confirm.  Epidermal cyst  Related Procedures Ambulatory referral to Podiatry   Dermatitis Arms, hands, abdomen Exam: Scaly pink patches forearms, abdomen, hands  Chronic and persistent condition with duration or expected duration over one year. Condition is bothersome/symptomatic for patient. Currently flared.   Atopic dermatitis (eczema) is a chronic, relapsing, pruritic condition that can significantly affect quality of life. It is often associated with  allergic rhinitis and/or asthma and can require treatment with topical medications, phototherapy, or in severe cases biologic injectable medication (Dupixent; Adbry) or Oral JAK inhibitors.   Treatment Plan: Start Mometasone cr qd/bid aa eczema until clear, then prn flares  Recommend mild cleanser and moisturizer every day Recommend mild soap and moisturizing cream 1-2 times daily.  Gentle skin care handout provided.    Topical steroids (such as triamcinolone, fluocinolone, fluocinonide, mometasone, clobetasol, halobetasol, betamethasone, hydrocortisone) can cause thinning and lightening of the skin if they are used for too long in the same area. Your physician has selected the right strength medicine for your problem and area affected on the body. Please use your medication only as directed by your physician to prevent side effects.     Return if symptoms worsen or fail to improve.  I, Ardis Rowan, RMA, am acting as scribe for Willeen Niece, MD .   Documentation: I have reviewed the above documentation for accuracy and completeness, and I agree with the above.  Willeen Niece, MD

## 2023-02-19 NOTE — Patient Instructions (Addendum)
Dry Skin Care  What causes dry skin?  Dry skin is common and results from inadequate moisture in the outer skin layers. Dry skin usually results from the excessive loss of moisture from the skin surface. This occurs due to two major factors: Normally the skin's oil glands deposit a layer of oil on the skin's surface. This layer of oil prevents the loss of moisture from the skin. Exposure to soaps, cleaners, solvents, and disinfectants removes this oily film, allowing water to escape. Water loss from the skin increases when the humidity is low. During winter months we spend a lot of time indoors where the air is heated. Heated air has very low humidity. This also contributes to dry skin.  A tendency for dry skin may accompany such disorders as eczema. Also, as people age, the number of functioning oil glands decreases, and the tendency toward dry skin can be a sensation of skin tightness when emerging from the shower.  How do I manage dry skin?  Humidify your environment. This can be accomplished by using a humidifier in your bedroom at night during winter months. Bathing can actually put moisture back into your skin if done right. Take the following steps while bathing to sooth dry skin: Avoid hot water, which only dries the skin and makes itching worse. Use warm water. Avoid washcloths or extensive rubbing or scrubbing. Use mild soaps like unscented Dove, Oil of Olay, Cetaphil, Basis, or CeraVe. If you take baths rather than showers, rinse off soap residue with clean water before getting out of tub. Once out of the shower/tub, pat dry gently with a soft towel. Leave your skin damp. While still damp, apply any medicated ointment/cream you were prescribed to the affected areas. After you apply your medicated ointment/cream, then apply your moisturizer to your whole body.This is the most important step in dry skin care. If this is omitted, your skin will continue to be dry. The choice of  moisturizer is also very important. In general, lotion will not provider enough moisture to severely dry skin because it is water based. You should use an ointment or cream. Moisturizers should also be unscented. Good choices include Vaseline (plain petrolatum), Aquaphor, Cetaphil, CeraVe, Vanicream, DML Forte, Aveeno moisture, or Eucerin Cream. Bath oils can be helpful, but do not replace the application of moisturizer after the bath. In addition, they make the tub slippery causing an increased risk for falls. Therefore, we do not recommend their use.    Due to recent changes in healthcare laws, you may see results of your pathology and/or laboratory studies on MyChart before the doctors have had a chance to review them. We understand that in some cases there may be results that are confusing or concerning to you. Please understand that not all results are received at the same time and often the doctors may need to interpret multiple results in order to provide you with the best plan of care or course of treatment. Therefore, we ask that you please give [place] 2 business days to thoroughly review all your results before contacting the office for clarification. Should we see a critical lab result, you will be contacted sooner.   If You Need Anything After Your Visit  If you have any questions or concerns for your doctor, please call our main line at [phone] and press option 4 to reach your doctor's medical assistant. If no one answers, please leave a voicemail as directed and we will return your call as soon as possible.  Messages left after 4 pm will be answered the following business day.   You may also send [place] a message via MyChart. We typically respond to MyChart messages within 1-2 business days.  For prescription refills, please ask your pharmacy to contact our office. Our fax number is [phone].  If you have an urgent issue when the clinic is closed that cannot wait until the next business  day, you can page your doctor at the number below.    Please note that while we do our best to be available for urgent issues outside of office hours, we are not available 24/7.   If you have an urgent issue and are unable to reach [place], you may choose to seek medical care at your doctor's office, retail clinic, urgent care center, or emergency room.  If you have a medical emergency, please immediately call 911 or go to the emergency department.  Pager Numbers  - Dr. Gwen Pounds: [phone]  - Dr. Roseanne Reno: [phone]  - Dr. Katrinka Blazing: [phone]   In the event of inclement weather, please call our main line at [phone] for an update on the status of any delays or closures.  Dermatology Medication Tips: Please keep the boxes that topical medications come in in order to help keep track of the instructions about where and how to use these. Pharmacies typically print the medication instructions only on the boxes and not directly on the medication tubes.   If your medication is too expensive, please contact our office at [phone] option 4 or send [place] a message through MyChart.   We are unable to tell what your co-pay for medications will be in advance as this is different depending on your insurance coverage. However, we may be able to find a substitute medication at lower cost or fill out paperwork to get insurance to cover a needed medication.   If a prior authorization is required to get your medication covered by your insurance company, please allow [place] 1-2 business days to complete this process.  Drug prices often vary depending on where the prescription is filled and some pharmacies may offer cheaper prices.  The website www.goodrx.com contains coupons for medications through different pharmacies. The prices here do not account for what the cost may be with help from insurance (it may be cheaper with your insurance), but the website can give you the price if you did not use any  insurance.  - You can print the associated coupon and take it with your prescription to the pharmacy.  - You may also stop by our office during regular business hours and pick up a GoodRx coupon card.  - If you need your prescription sent electronically to a different pharmacy, notify our office through [facility] or by phone at [phone] option 4.     Si Usted Necesita Algo Despus de Su Visita  Tambin puede enviarnos un mensaje a travs de Clinical cytogeneticist. Por lo general respondemos a los mensajes de MyChart en el transcurso de 1 a 2 das hbiles.  Para renovar recetas, por favor pida a su farmacia que se ponga en contacto con nuestra oficina. Annie Sable de fax es [place] [phone].  Si tiene un asunto urgente cuando la clnica est cerrada y que no puede esperar hasta el siguiente da hbil, puede llamar/localizar a su doctor(a) al nmero que aparece a continuacin.   Por favor, tenga en cuenta que aunque hacemos todo lo posible para estar disponibles para asuntos urgentes fuera del horario de [place],  no estamos disponibles las 24 horas del da, los 7 [address]  [address] de la [place].   Si tiene un problema urgente y no puede comunicarse con nosotros, puede optar por buscar atencin mdica  en el consultorio de su doctor(a), en una clnica privada, en un centro de atencin urgente o en una sala de emergencias.  Si tiene Engineer, drilling, por favor llame inmediatamente al 911 o vaya a la sala de emergencias.  Nmeros de bper  - Dr. Gwen Pounds: [phone]  - Dra. Roseanne Reno: [phone]  - Dr. Katrinka Blazing: [phone]   En caso de inclemencias del tiempo, por favor llame a Lacy Duverney principal al [phone] para una actualizacin sobre el [place] de cualquier retraso o cierre.  Consejos para la medicacin en dermatologa: Por favor, guarde las cajas en las que vienen los medicamentos de uso tpico para ayudarle a seguir las instrucciones sobre dnde y cmo usarlos. Las farmacias  generalmente imprimen las instrucciones del medicamento slo en las cajas y no directamente en los tubos del [place].   Si su medicamento es muy caro, por favor, pngase en contacto con Rolm Gala llamando al [phone] y presione la opcin 4 o envenos un mensaje a travs de Clinical cytogeneticist.   No podemos decirle cul ser su copago por los medicamentos por adelantado ya que esto es diferente dependiendo de la cobertura de su seguro. Sin embargo, es posible que podamos encontrar un medicamento sustituto a Audiological scientist un formulario para que el seguro cubra el medicamento que se considera necesario.   Si se requiere una autorizacin previa para que su compaa de seguros [place] su medicamento, por favor permtanos de 1 a 2 das hbiles para completar [address]  Los precios de los medicamentos varan con frecuencia dependiendo del Environmental consultant de dnde se surte la receta y alguna farmacias pueden ofrecer precios ms baratos.  El sitio web www.goodrx.com tiene cupones para medicamentos de Health and safety inspector. Los precios aqu no tienen en cuenta lo que podra costar con la ayuda del seguro (puede ser ms barato con su seguro), pero el sitio web puede darle el precio si no utiliz Tourist information centre manager.  - Puede imprimir el cupn correspondiente y llevarlo con su receta a la farmacia.  - Tambin puede pasar por nuestra oficina durante el horario de atencin regular y Education officer, museum una tarjeta de cupones de GoodRx.  - Si necesita que su receta se enve electrnicamente a una farmacia diferente, informe a nuestra oficina a travs de MyChart de [place] o por telfono llamando al [phone] y presione la opcin 4.

## 2023-03-15 ENCOUNTER — Encounter: Payer: Self-pay | Admitting: Podiatry

## 2023-03-15 ENCOUNTER — Ambulatory Visit (INDEPENDENT_AMBULATORY_CARE_PROVIDER_SITE_OTHER): Payer: 59 | Admitting: Podiatry

## 2023-03-15 DIAGNOSIS — M67471 Ganglion, right ankle and foot: Secondary | ICD-10-CM | POA: Diagnosis not present

## 2023-03-15 NOTE — Progress Notes (Signed)
  Subjective:  Patient ID: Robert Bush., male    DOB: [birth year],  MRN: [id number]  Chief Complaint  Patient presents with   Ganglion Cyst    "I have a bump on the side of my foot."    47 y.o. male presents with the above complaint.  Patient presents with complaint of right lateral soft tissue mass/ganglion cyst.  Patient states it does not bother him does not cause him pain has been about the same side he wanted get it eval make sure everything is okay.  Pain scale is 0 out of 10 sometimes sharp pain noted when he has been in the yard.  Denies any other acute complaints wanted get it evaluated.   Review of Systems: Negative except as noted in the HPI. Denies N/V/F/Ch.  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 (Patient not taking: Reported on 11/13/2022), Disp: 14 tablet, Rfl: 0   mometasone (ELOCON) 0.1 % cream, Apply 1 Application topically as directed. qd to bid aa eczema on hands, arms, trunk until clear, then prn flares (Patient not taking: Reported on 03/15/2023), Disp: 45 g, Rfl: 2  Social History   Tobacco Use  Smoking Status Former   Types: Cigarettes  Smokeless Tobacco Never  Tobacco Comments   Former social smoker    No Known Allergies Objective:  There were no vitals filed for this visit. There is no height or weight on file to calculate BMI. Constitutional Well developed. Well nourished.  Vascular Dorsalis pedis pulses palpable bilaterally. Posterior tibial pulses palpable bilaterally. Capillary refill normal to all digits.  No cyanosis or clubbing noted. Pedal hair growth normal.  Neurologic Normal speech. Oriented to person, place,  and time. Epicritic sensation to light touch grossly present bilaterally.  Dermatologic Right lateral soft tissue mass noted pain on palpation to the soft tissue mass.  Appears to be gelatinous in nature is positive transilluminates.  Mobile single lobulated.  Not hard indurated  Orthopedic: Normal joint ROM without pain or crepitus bilaterally. No visible deformities. No bony tenderness.   Radiographs: None Assessment:   1. Ganglion cyst of right foot    Plan:  Patient was evaluated and treated and all questions answered.  Right lateral foot ganglion cyst -All questions and concerns were discussed with the patient in extensive detail.  At this time this is not bothering him it appears to be well-circumscribed without any pain.  I discussed that patient may in the future benefit from drainage of the soft tissue mass he states since will think about and will get back to me.  If it continues to bother him in the future he will benefit from surgical excision as well.  No follow-ups on file.

## 2023-04-09 ENCOUNTER — Other Ambulatory Visit: Payer: Self-pay | Admitting: Family Medicine

## 2023-04-09 DIAGNOSIS — Z0000 Encounter for general adult medical examination without abnormal findings: Secondary | ICD-10-CM

## 2023-04-09 DIAGNOSIS — Z8249 Family history of ischemic heart disease and other diseases of the circulatory system: Secondary | ICD-10-CM

## 2023-04-20 ENCOUNTER — Ambulatory Visit
Admission: RE | Admit: 2023-04-20 | Discharge: 2023-04-20 | Disposition: A | Discharge status: Home / Self Care | Payer: Self-pay | Source: Ambulatory Visit | Attending: Family Medicine | Admitting: Family Medicine

## 2023-04-20 DIAGNOSIS — Z8249 Family history of ischemic heart disease and other diseases of the circulatory system: Secondary | ICD-10-CM | POA: Insufficient documentation

## 2023-04-20 DIAGNOSIS — Z0000 Encounter for general adult medical examination without abnormal findings: Secondary | ICD-10-CM | POA: Insufficient documentation

## 2023-05-16 ENCOUNTER — Ambulatory Visit (INDEPENDENT_AMBULATORY_CARE_PROVIDER_SITE_OTHER): Payer: 59

## 2023-05-16 ENCOUNTER — Ambulatory Visit
Admission: RE | Admit: 2023-05-16 | Discharge: 2023-05-16 | Disposition: A | Discharge status: Home / Self Care | Payer: 59 | Source: Ambulatory Visit | Attending: Family Medicine | Admitting: Family Medicine

## 2023-05-16 VITALS — BP 141/95 | HR 74 | Temp 98.6°F | Resp 16 | Ht 75.0 in | Wt 330.0 lb

## 2023-05-16 DIAGNOSIS — R051 Acute cough: Secondary | ICD-10-CM

## 2023-05-16 DIAGNOSIS — J069 Acute upper respiratory infection, unspecified: Secondary | ICD-10-CM | POA: Diagnosis not present

## 2023-05-16 MED ORDER — PREDNISONE 10 MG (21) PO TBPK: ORAL_TABLET | Freq: Every day | ORAL | 0 refills | Status: AC

## 2023-05-16 MED ORDER — ALBUTEROL SULFATE HFA 108 (90 BASE) MCG/ACT IN AERS: 2.0000 | INHALATION_SPRAY | RESPIRATORY_TRACT | 0 refills | Status: AC | PRN

## 2023-05-16 MED ORDER — HYDROCOD POLI-CHLORPHE POLI ER 10-8 MG/5ML PO SUER: 5.0000 mL | Freq: Two times a day (BID) | ORAL | 0 refills | Status: AC | PRN

## 2023-05-16 NOTE — ED Triage Notes (Signed)
 Pt c/o cough x5 days. States Sides in Pain from cough - Entered by patient

## 2023-05-16 NOTE — ED Provider Notes (Signed)
 [facility]    CSN: [id number] Arrival date & time: 05/16/23  1427      History   Chief Complaint Chief Complaint  Patient presents with   Cough    Appt    HPI Robert Bush. is a 48 y.o. male.   HPI  History obtained from the patient. Robert Bush presents for cough, bilateral rib pain, nasal congestion and body aches that started 5 days ago. Endorses mild headache, sore throat from cough. Took mucinex, prescription cough medication which helped. Cough gets worse at night. No fever, vomiting or diarrhea.  His wife got sick as well but she had fever.   He denies smoking history. Has alpha-1-antitrypsin deficiency.  Had asthma when he was a teenager.     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   Past Surgical History:  Procedure Laterality Date   COLON SURGERY     Hirschprung's corrective surgery   COLONOSCOPY WITH PROPOFOL  N/A 11/13/2022   Procedure: COLONOSCOPY WITH PROPOFOL ;  Surgeon: Shane Darling, MD;  Location: [facility] ENDOSCOPY;  Service: Endoscopy;  Laterality: N/A;   ESOPHAGOGASTRODUODENOSCOPY (EGD) WITH PROPOFOL  N/A 02/03/2019   Procedure: ESOPHAGOGASTRODUODENOSCOPY (EGD) WITH PROPOFOL ;  Surgeon: Deveron Fly, MD;  Location: [facility] ENDOSCOPY;  Service: Endoscopy;  Laterality: N/A;   ESOPHAGOGASTRODUODENOSCOPY (EGD) WITH PROPOFOL  N/A 11/13/2022   Procedure: ESOPHAGOGASTRODUODENOSCOPY (EGD) WITH PROPOFOL ;  Surgeon: Shane Darling, MD;  Location: [facility] ENDOSCOPY;  Service: Endoscopy;  Laterality: N/A;   KNEE ARTHROSCOPY Left 03/2018   SHOULDER ARTHROSCOPY WITH ROTATOR CUFF REPAIR Right 05/23/2018   Procedure: SHOULDER ARTHROSCOPY WITH MINI OPEN ROTATOR CUFF REPAIR;  Surgeon: Rande Bushy, MD;   Location: [facility] ORS;  Service: Orthopedics;  Laterality: Right;       Home Medications    Prior to Admission medications   Medication Sig Start Date End Date Taking? Authorizing Provider  albuterol  (VENTOLIN  HFA) 108 (90 Base) MCG/ACT inhaler Inhale 2 puffs into the lungs every 4 (four) hours as needed. 05/16/23  Yes Liley Rake, DO  chlorpheniramine-HYDROcodone (TUSSIONEX) 10-8 MG/5ML Take 5 mLs by mouth every 12 (twelve) hours as needed. 05/16/23  Yes Tykel Badie, DO  predniSONE  (STERAPRED UNI-PAK 21 TAB) 10 MG (21) TBPK tablet Take by mouth daily. Take 6 tabs by mouth daily for 1, then 5 tabs for 1 day, then 4 tabs for 1 day, then 3 tabs for 1 day, then 2 tabs for 1 day, then 1 tab for 1 day. 05/16/23  Yes Traye Bates, DO  mometasone  (ELOCON ) 0.1 % cream Apply 1 Application topically as directed. qd to bid aa eczema on hands, arms, trunk until clear, then prn flares Patient not taking: Reported on 03/15/2023 02/19/23   Artemio Zakkery, MD    Family History Family History  Problem Relation Age of Onset   Atrial fibrillation Mother    Heart failure Father    Diabetes Father    Kidney failure Father     Social History Social History   Tobacco Use   Smoking status: Former    Types: Cigarettes   Smokeless tobacco: Never   Tobacco comments:    Former social smoker  Advertising account planner   Vaping status: Never Used  Substance  Use Topics   Alcohol use: Yes    Alcohol/week: 9.0 standard drinks of alcohol    Types: 9 Cans of beer per week    Comment: occasionally   Drug use: Never     Allergies   Patient has no known allergies.   Review of Systems Review of Systems: negative unless otherwise stated in HPI.      Physical Exam Triage Vital Signs ED Triage Vitals  Encounter Vitals Group     BP 05/16/23 1442 (!) 141/95     Systolic BP Percentile --      Diastolic BP Percentile --      Pulse Rate 05/16/23 1442 74     Resp 05/16/23 1442 16     Temp 05/16/23 1442 98.6 F  (37 C)     Temp Source 05/16/23 1442 Oral     SpO2 05/16/23 1442 97 %     Weight 05/16/23 1441 (!) 330 lb (149.7 kg)     Height 05/16/23 1441 6\' 3"  (1.905 m)     Head Circumference --      Peak Flow --      Pain Score 05/16/23 1448 0     Pain Loc --      Pain Education --      Exclude from Growth Chart --    No data found.  Updated Vital Signs BP (!) 141/95 (BP Location: Left Arm)   Pulse 74   Temp 98.6 F (37 C) (Oral)   Resp 16   Ht 6\' 3"  (1.905 m)   Wt (!) 149.7 kg   SpO2 97%   BMI 41.25 kg/m   Visual Acuity Right Eye Distance:   Left Eye Distance:   Bilateral Distance:    Right Eye Near:   Left Eye Near:    Bilateral Near:     Physical Exam GEN:     alert, non-toxic appearing male in no distress    HENT:  mucus membranes moist, oropharyngeal without lesions or erythema, no tonsillar hypertrophy or exudates, no nasal discharge EYES:   no scleral injection or discharge NECK:  good ROM, no meningismus   RESP:  no increased work of breathing, coarse breath sounds bilaterally CVS:   regular rate and rhythm Skin:   warm and dry    UC Treatments / Results  Labs (all labs ordered are listed, but only abnormal results are displayed) Labs Reviewed - No data to display  EKG   Radiology DG Chest 2 View Result Date: 05/16/2023 CLINICAL DATA:  Cough EXAM: CHEST - 2 VIEW COMPARISON:  12/17/2017 FINDINGS: The heart size and mediastinal contours are within normal limits. Both lungs are clear. The visualized skeletal structures are unremarkable. IMPRESSION: Normal study. Electronically Signed   By: Janeece Mechanic M.D.   On: 05/16/2023 17:23    Procedures Procedures (including critical care time)  Medications Ordered in UC Medications - No data to display  Initial Impression / Assessment and Plan / UC Course  I have reviewed the triage vital signs and the nursing notes.  Pertinent labs & imaging results that were available during my care of the patient were  reviewed by me and considered in my medical decision making (see chart for details).       Pt is a 48 y.o. male who has alpha 1 antitrypsin deficiency presents for 5 days of respiratory symptoms. Robert Bush is afebrile here without recent antipyretics. Satting well on room air. Overall pt is non-toxic appearing, well hydrated, without respiratory  distress. Pulmonary exam is remarkable for coarse breath sounds bilaterally and frequent cough with deep breathing.  COVID testing deferred due to duration of symptoms.   Chest xray personally reviewed by me without focal pneumonia, pleural effusion, cardiomegaly or pneumothorax. Patient aware the radiologist has not read his xray and is comfortable with the preliminary read by me. Will review radiologist read when available and call patient if a change in plan is warranted.  Pt agreeable to this plan prior to discharge.   History most consistent with viral respiratory illness.  Discussed symptomatic treatment.  Explained lack of efficacy of antibiotics in viral disease.  Typical duration of symptoms discussed.   Return and ED precautions given and voiced understanding. Discussed MDM, treatment plan and plan for follow-up with patient who agrees with plan.   Radiologist impression reviewed.  Final Clinical Impressions(s) / UC Diagnoses   Final diagnoses:  Acute cough     Discharge Instructions      Your chest xray did not show evidence of pneumonia though the radiologist has not yet read it. If they find something that I didn't, I will call you.  See your results in MyChart.  Stop by the pharmacy to pick up your albuterol  inhaler and cough medication.  I also sent in a course of steroids as well.     ED Prescriptions     Medication Sig Dispense Auth. Provider   predniSONE  (STERAPRED UNI-PAK 21 TAB) 10 MG (21) TBPK tablet Take by mouth daily. Take 6 tabs by mouth daily for 1, then 5 tabs for 1 day, then 4 tabs for 1 day, then 3 tabs for 1 day,  then 2 tabs for 1 day, then 1 tab for 1 day. 21 tablet Virginia Francisco, DO   albuterol  (VENTOLIN  HFA) 108 (90 Base) MCG/ACT inhaler Inhale 2 puffs into the lungs every 4 (four) hours as needed. 6.7 g Annick Dimaio, DO   chlorpheniramine-HYDROcodone (TUSSIONEX) 10-8 MG/5ML Take 5 mLs by mouth every 12 (twelve) hours as needed. 115 mL Lenox Ladouceur, DO      I have reviewed the PDMP during this encounter.   Jonessa Triplett, DO 05/16/23 1756

## 2023-05-16 NOTE — Discharge Instructions (Addendum)
 Your chest xray did not show evidence of pneumonia though the radiologist has not yet read it. If they find something that I didn't, I will call you.  See your results in MyChart.  Stop by the pharmacy to pick up your albuterol  inhaler and cough medication.  I also sent in a course of steroids as well.
